# Patient Record
Sex: Female | Born: 1945
Health system: Southern US, Community
[De-identification: ages and names within clinical notes are randomized; demographics above are authoritative.]

## PROBLEM LIST (undated history)

## (undated) DIAGNOSIS — R112 Nausea with vomiting, unspecified: Secondary | ICD-10-CM

## (undated) DIAGNOSIS — Z923 Personal history of irradiation: Secondary | ICD-10-CM

## (undated) DIAGNOSIS — Z9889 Other specified postprocedural states: Secondary | ICD-10-CM

## (undated) DIAGNOSIS — T8859XA Other complications of anesthesia, initial encounter: Secondary | ICD-10-CM

## (undated) DIAGNOSIS — T4145XA Adverse effect of unspecified anesthetic, initial encounter: Secondary | ICD-10-CM

## (undated) DIAGNOSIS — C50411 Malignant neoplasm of upper-outer quadrant of right female breast: Secondary | ICD-10-CM

## (undated) HISTORY — PX: DILATION AND CURETTAGE OF UTERUS: SHX78

## (undated) HISTORY — DX: Malignant neoplasm of upper-outer quadrant of right female breast: C50.411

## (undated) HISTORY — DX: Personal history of irradiation: Z92.3

---

## 2015-04-13 ENCOUNTER — Encounter (HOSPITAL_BASED_OUTPATIENT_CLINIC_OR_DEPARTMENT_OTHER): Payer: Self-pay

## 2015-04-13 ENCOUNTER — Emergency Department (HOSPITAL_BASED_OUTPATIENT_CLINIC_OR_DEPARTMENT_OTHER): Payer: Medicare HMO

## 2015-04-13 ENCOUNTER — Emergency Department (HOSPITAL_BASED_OUTPATIENT_CLINIC_OR_DEPARTMENT_OTHER)
Admission: EM | Admit: 2015-04-13 | Discharge: 2015-04-13 | Disposition: A | Discharge status: Home / Self Care | Payer: Medicare HMO | Attending: Emergency Medicine | Admitting: Emergency Medicine

## 2015-04-13 DIAGNOSIS — R002 Palpitations: Secondary | ICD-10-CM | POA: Diagnosis not present

## 2015-04-13 DIAGNOSIS — R079 Chest pain, unspecified: Secondary | ICD-10-CM | POA: Insufficient documentation

## 2015-04-13 LAB — CBC
HCT: 46.1 % — ABNORMAL HIGH (ref 36.0–46.0)
Hemoglobin: 15.3 g/dL — ABNORMAL HIGH (ref 12.0–15.0)
MCH: 30.3 pg (ref 26.0–34.0)
MCHC: 33.2 g/dL (ref 30.0–36.0)
MCV: 91.3 fL (ref 78.0–100.0)
Platelets: 334 10*3/uL (ref 150–400)
RBC: 5.05 MIL/uL (ref 3.87–5.11)
RDW: 13.7 % (ref 11.5–15.5)
WBC: 9 10*3/uL (ref 4.0–10.5)

## 2015-04-13 LAB — COMPREHENSIVE METABOLIC PANEL
ALBUMIN: 3.8 g/dL (ref 3.5–5.0)
ALK PHOS: 77 U/L (ref 38–126)
ALT: 29 U/L (ref 14–54)
ANION GAP: 8 (ref 5–15)
AST: 34 U/L (ref 15–41)
BILIRUBIN TOTAL: 0.4 mg/dL (ref 0.3–1.2)
BUN: 11 mg/dL (ref 6–20)
CALCIUM: 9.8 mg/dL (ref 8.9–10.3)
CO2: 29 mmol/L (ref 22–32)
Chloride: 102 mmol/L (ref 101–111)
Creatinine, Ser: 0.78 mg/dL (ref 0.44–1.00)
Glucose, Bld: 123 mg/dL — ABNORMAL HIGH (ref 65–99)
Potassium: 4.3 mmol/L (ref 3.5–5.1)
SODIUM: 139 mmol/L (ref 135–145)
Total Protein: 7.6 g/dL (ref 6.5–8.1)

## 2015-04-13 LAB — TROPONIN I: Troponin I: <0.03 ng/mL (ref <0.031)

## 2015-04-13 NOTE — ED Notes (Addendum)
C/o irregular HR with pain to left arm approx 2 hours PTA-took ASA PTA-denies pain at this time-taken to tx area via w/c

## 2015-04-13 NOTE — ED Provider Notes (Signed)
CSN: [id number]     Arrival date & time 04/13/15  1424 History   First MD Initiated Contact with Patient 04/13/15 1441     Chief Complaint  Patient presents with  . Chest Pain     (Consider location/radiation/quality/duration/timing/severity/associated sxs/prior Treatment) Patient is a 69 y.o. female presenting with chest pain. The history is provided by the patient and a relative.  Chest Pain Pain location:  Substernal area and L chest Pain quality: dull and pressure   Pain radiates to:  Mid back and L arm Pain radiates to the back: yes   Pain severity:  Mild Onset quality:  Gradual Duration:  1 hour Timing:  Intermittent Chronicity:  New Context: not breathing, no drug use, not lifting, no movement and no trauma   Associated symptoms: palpitations   Associated symptoms: no abdominal pain, no back pain, no cough, no dizziness, no fever, no headache, no nausea, no shortness of breath and not vomiting     History reviewed. No pertinent past medical history. Past Surgical History  Procedure Laterality Date  . Dilation and curettage of uterus     No family history on file. History  Substance Use Topics  . Smoking status: Never Smoker   . Smokeless tobacco: Not on file  . Alcohol Use: No   OB History    No data available     Review of Systems  Constitutional: Negative for fever, chills and activity change.  HENT: Negative for congestion.   Respiratory: Negative for cough, chest tightness and shortness of breath.   Cardiovascular: Positive for chest pain and palpitations.  Gastrointestinal: Negative for nausea, vomiting and abdominal pain.  Endocrine: Negative for polydipsia and polyuria.  Genitourinary: Negative for dysuria, frequency and pelvic pain.  Musculoskeletal: Negative for back pain.  Skin: Negative for pallor and wound.  Neurological: Negative for dizziness, seizures and headaches.      Allergies  Review of patient's allergies indicates no known  allergies.  Home Medications   Prior to Admission medications   Not on File   BP 147/92 mmHg  Pulse 96  Temp(Src) 98.3 F (36.8 C) (Oral)  Resp 18  Ht 5\' 9"  (1.753 m)  Wt 260 lb (117.935 kg)  BMI 38.38 kg/m2  SpO2 97% Physical Exam  Constitutional: She is oriented to person, place, and time. She appears well-developed and well-nourished.  HENT:  Head: Normocephalic and atraumatic.  Eyes: Conjunctivae and EOM are normal. Right eye exhibits no discharge. Left eye exhibits no discharge.  Cardiovascular: Normal rate and regular rhythm.   Pulmonary/Chest: Effort normal and breath sounds normal. No respiratory distress.  Abdominal: Soft. She exhibits no distension. There is no tenderness. There is no rebound.  Musculoskeletal: Normal range of motion. She exhibits no edema or tenderness.  Neurological: She is alert and oriented to person, place, and time.  Skin: Skin is warm and dry.  Nursing note and vitals reviewed.   ED Course  Procedures (including critical care time) Labs Review Labs Reviewed - No data to display  Imaging Review No results found.   EKG Interpretation   Date/Time:  Wednesday April 13 2015 14:37:14 EDT Ventricular Rate:  93 PR Interval:  152 QRS Duration: 94 QT Interval:  364 QTC Calculation: 452 R Axis:     Text Interpretation:  Normal sinus rhythm Possible Left atrial enlargement  Incomplete right bundle branch block Confirmed by HARRISON  MD, FORREST  ([id number]) on 04/13/2015 2:40:04 PM      MDM   Final diagnoses:  None   C52-year-old female with unknown medical history as she has not seen a doctor in 4 years presents immersed department today for acute onset left-sided chest dullness also in her back and in her neck last approximately 30 minutes no other associated symptoms. Rest of history and exam benign as documented above. Patient is low risk for ACS so we will do a delta troponin. EKG without any evidence of acute ischemia. Cardiac aspirin at  home. No significant cough or fever to be concern for pneumonia. PE also unlikely.   Troponin negative, labs unremarkable. Will repeat troponin 3 hours after initial along with repeat ecg to ensure no changes.   Repeat troponin and ecg negative. Will dc to fu w/ cardiology or pcp.   I have personally and contemperaneously reviewed labs and imaging and used in my decision making as above.   I feel the patient has had an appropriate workup for their chief complaint at this time and likelihood of emergent condition existing is low. They have been counseled on decision, discharge, follow up and which symptoms necessitate immediate return to the emergency department. They or their family verbally stated understanding and agreement with plan and discharged in stable condition.     Merrily Pew, MD 04/13/15 2109

## 2016-05-03 ENCOUNTER — Ambulatory Visit (INDEPENDENT_AMBULATORY_CARE_PROVIDER_SITE_OTHER): Payer: Medicare HMO | Admitting: Family Medicine

## 2016-05-03 ENCOUNTER — Encounter: Payer: Self-pay | Admitting: Family Medicine

## 2016-05-03 VITALS — BP 126/80 | HR 52 | Temp 98.0°F | Resp 16 | Ht 69.0 in | Wt 249.4 lb

## 2016-05-03 DIAGNOSIS — Z1231 Encounter for screening mammogram for malignant neoplasm of breast: Secondary | ICD-10-CM | POA: Diagnosis not present

## 2016-05-03 DIAGNOSIS — M21921 Unspecified acquired deformity of right upper arm: Secondary | ICD-10-CM

## 2016-05-03 DIAGNOSIS — Z0000 Encounter for general adult medical examination without abnormal findings: Secondary | ICD-10-CM

## 2016-05-03 DIAGNOSIS — E669 Obesity, unspecified: Secondary | ICD-10-CM

## 2016-05-03 DIAGNOSIS — M21931 Unspecified acquired deformity of right forearm: Secondary | ICD-10-CM | POA: Diagnosis not present

## 2016-05-03 LAB — HEPATIC FUNCTION PANEL
ALK PHOS: 76 U/L (ref 39–117)
ALT: 21 U/L (ref 0–35)
AST: 25 U/L (ref 0–37)
Albumin: 4.2 g/dL (ref 3.5–5.2)
BILIRUBIN DIRECT: 0.1 mg/dL (ref 0.0–0.3)
Total Bilirubin: 0.6 mg/dL (ref 0.2–1.2)
Total Protein: 7.4 g/dL (ref 6.0–8.3)

## 2016-05-03 LAB — CBC WITH DIFFERENTIAL/PLATELET
BASOS ABS: 0.1 10*3/uL (ref 0.0–0.1)
Basophils Relative: 0.7 % (ref 0.0–3.0)
EOS ABS: 0.1 10*3/uL (ref 0.0–0.7)
Eosinophils Relative: 0.9 % (ref 0.0–5.0)
HCT: 44.5 % (ref 36.0–46.0)
Hemoglobin: 15 g/dL (ref 12.0–15.0)
LYMPHS ABS: 2.4 10*3/uL (ref 0.7–4.0)
Lymphocytes Relative: 26.2 % (ref 12.0–46.0)
MCHC: 33.6 g/dL (ref 30.0–36.0)
MCV: 91.3 fl (ref 78.0–100.0)
Monocytes Absolute: 0.6 10*3/uL (ref 0.1–1.0)
Monocytes Relative: 6.8 % (ref 3.0–12.0)
NEUTROS ABS: 5.9 10*3/uL (ref 1.4–7.7)
NEUTROS PCT: 65.4 % (ref 43.0–77.0)
PLATELETS: 330 10*3/uL (ref 150.0–400.0)
RBC: 4.88 Mil/uL (ref 3.87–5.11)
RDW: 13.5 % (ref 11.5–15.5)
WBC: 9 10*3/uL (ref 4.0–10.5)

## 2016-05-03 LAB — BASIC METABOLIC PANEL
BUN: 14 mg/dL (ref 6–23)
CO2: 29 meq/L (ref 19–32)
Calcium: 10 mg/dL (ref 8.4–10.5)
Chloride: 104 mEq/L (ref 96–112)
Creatinine, Ser: 0.74 mg/dL (ref 0.40–1.20)
GFR: 82.89 mL/min (ref >60.00)
Glucose, Bld: 103 mg/dL — ABNORMAL HIGH (ref 70–99)
POTASSIUM: 4.4 meq/L (ref 3.5–5.1)
SODIUM: 141 meq/L (ref 135–145)

## 2016-05-03 LAB — LIPID PANEL
CHOLESTEROL: 211 mg/dL — AB (ref 0–200)
HDL: 38.9 mg/dL — ABNORMAL LOW (ref >39.00)
LDL Cholesterol: 149 mg/dL — ABNORMAL HIGH (ref 0–99)
NonHDL: 171.67
Total CHOL/HDL Ratio: 5
Triglycerides: 111 mg/dL (ref 0.0–149.0)
VLDL: 22.2 mg/dL (ref 0.0–40.0)

## 2016-05-03 LAB — TSH: TSH: 0.97 u[IU]/mL (ref 0.35–4.50)

## 2016-05-03 NOTE — Progress Notes (Signed)
Pre visit review using our clinic review tool, if applicable. No additional management support is needed unless otherwise documented below in the visit note. 

## 2016-05-03 NOTE — Patient Instructions (Addendum)
Follow up in 1 year or as needed We'll notify you of your lab results and make any changes if needed Continue to work on healthy diet and regular exercise- you can do it!!! We'll call you with your mammogram and ortho appts Please consider a cologuard or colonoscopy referral Call with any questions or concerns Welcome!  We're glad to have you! Enjoy the rest of your summer!!!

## 2016-05-03 NOTE — Progress Notes (Signed)
Subjective:    Patient ID: Mary Ochoa, female    DOB: [birth year], 71 y.o.   MRN: [id number]  HPI New to establish.  Moved from [place] 5 yrs ago- no recent physician  Here today for CPE.  Risk Factors: Obesity- ongoing issue for pt.  Has recently given up soda Physical Activity: exercising regularly on her [place] at home Fall Risk: low Depression: denies Hearing: normal to conversational tones, whispered voice at 6 ft ADL's: independent Cognitive: normal linear thought process, memory and attention intact Home Safety: safe at home Height, Weight, BMI, Visual Acuity: see vitals, vision corrected to 20/20 w/ reading glasses Counseling: due for colonoscopy (pt declines- not interested in cologuard either, 'i'm not going to worry about that right now'), mammo/DEXA (declines DEXA), Prevnar (declines at this time) Care team reviewed- no additional providers at this time [place]- pt does not have, not interested at this time - 'i need to think about that'. Labs Ordered: See A&P Care Plan: See A&P    Review of Systems Patient reports no vision/ hearing changes, adenopathy,fever, weight change,  persistant/recurrent hoarseness , swallowing issues, chest pain, palpitations, edema, persistant/recurrent cough, hemoptysis, dyspnea (rest/exertional/paroxysmal nocturnal), gastrointestinal bleeding (melena, rectal bleeding), abdominal pain, significant heartburn, bowel changes, GU symptoms (dysuria, hematuria, incontinence), Gyn symptoms (abnormal  bleeding, pain),  syncope, focal weakness, memory loss, numbness & tingling, skin/hair/nail changes, abnormal bruising or bleeding, anxiety, or depression.     Objective:   Physical Exam General Appearance:    Alert, cooperative, no distress, appears stated age  Head:    Normocephalic, without obvious abnormality, atraumatic  Eyes:    PERRL, conjunctiva/corneas clear, EOM's intact, fundi    benign, both eyes  Ears:    Normal TM's and  external ear canals, both ears  Nose:   Nares normal, septum midline, mucosa normal, no drainage    or sinus tenderness  Throat:   Lips, mucosa, and tongue normal; teeth and gums normal  Neck:   Supple, symmetrical, trachea midline, no adenopathy;    Thyroid: no enlargement/tenderness/nodules  Back:     Symmetric, no curvature, ROM normal, no CVA tenderness  Lungs:     Clear to auscultation bilaterally, respirations unlabored  Chest Wall:    No tenderness or deformity   Heart:    Regular rate and rhythm, S1 and S2 normal, no murmur, rub   or gallop  Breast Exam:    Deferred to mammo  Abdomen:     Soft, non-tender, bowel sounds active all four quadrants,    no masses, no organomegaly  Genitalia:    Deferred   Rectal:    Extremities:   Extremities normal, atraumatic, no cyanosis or edema- freely mobile soft tissue mass w/ some calcification over R olecranon  Pulses:   2+ and symmetric all extremities  Skin:   Skin color, texture, turgor normal, no rashes or lesions  Lymph nodes:   Cervical, supraclavicular, and axillary nodes normal  Neurologic:   CNII-XII intact, normal strength, sensation and reflexes    throughout          Assessment & Plan:  CPE- pt's PE WNL w/ exception of obesity and R elbow abnormality.  Pt declines Prevnar, colonoscopy, DEXA.  Discussed need for healthcare POA/living will- pt to discuss this w/ family.  Check labs.  Anticipatory guidance provided.   Obesity- ongoing issue for pt.  She recently stopped drinking soda and started exercising 6 days/week on her [place].  Applauded her efforts.  Check labs  to risk stratify.  Will follow.  Elbow Deformity- new.  Pt w/ ganglion cyst vs other abnormality over R olecranon.  Refer to ortho for complete evaluation and tx.  Pt expressed understanding and is in agreement w/ plan.

## 2016-05-04 ENCOUNTER — Encounter: Payer: Self-pay | Admitting: General Practice

## 2016-05-10 ENCOUNTER — Encounter: Payer: Self-pay | Admitting: Family Medicine

## 2016-07-02 DIAGNOSIS — R2231 Localized swelling, mass and lump, right upper limb: Secondary | ICD-10-CM | POA: Diagnosis not present

## 2016-07-03 ENCOUNTER — Ambulatory Visit
Admission: RE | Admit: 2016-07-03 | Discharge: 2016-07-03 | Disposition: A | Discharge status: Home / Self Care | Payer: Medicare HMO | Source: Ambulatory Visit | Attending: Family Medicine | Admitting: Family Medicine

## 2016-07-03 DIAGNOSIS — Z1231 Encounter for screening mammogram for malignant neoplasm of breast: Secondary | ICD-10-CM | POA: Diagnosis not present

## 2016-07-06 ENCOUNTER — Other Ambulatory Visit: Payer: Self-pay | Admitting: Family Medicine

## 2016-07-06 DIAGNOSIS — R928 Other abnormal and inconclusive findings on diagnostic imaging of breast: Secondary | ICD-10-CM

## 2016-07-10 ENCOUNTER — Other Ambulatory Visit: Payer: Self-pay | Admitting: Family Medicine

## 2016-07-10 ENCOUNTER — Ambulatory Visit
Admission: RE | Admit: 2016-07-10 | Discharge: 2016-07-10 | Disposition: A | Discharge status: Home / Self Care | Payer: Medicare HMO | Source: Ambulatory Visit | Attending: Family Medicine | Admitting: Family Medicine

## 2016-07-10 DIAGNOSIS — R928 Other abnormal and inconclusive findings on diagnostic imaging of breast: Secondary | ICD-10-CM

## 2016-07-10 DIAGNOSIS — N6312 Unspecified lump in the right breast, upper inner quadrant: Secondary | ICD-10-CM | POA: Diagnosis not present

## 2016-07-12 ENCOUNTER — Other Ambulatory Visit: Payer: Self-pay | Admitting: Family Medicine

## 2016-07-12 ENCOUNTER — Ambulatory Visit
Admission: RE | Admit: 2016-07-12 | Discharge: 2016-07-12 | Disposition: A | Discharge status: Home / Self Care | Payer: Medicare HMO | Source: Ambulatory Visit | Attending: Family Medicine | Admitting: Family Medicine

## 2016-07-12 DIAGNOSIS — D0511 Intraductal carcinoma in situ of right breast: Secondary | ICD-10-CM | POA: Diagnosis not present

## 2016-07-12 DIAGNOSIS — R928 Other abnormal and inconclusive findings on diagnostic imaging of breast: Secondary | ICD-10-CM

## 2016-07-12 DIAGNOSIS — N6489 Other specified disorders of breast: Secondary | ICD-10-CM | POA: Diagnosis not present

## 2016-07-18 ENCOUNTER — Telehealth: Payer: Self-pay | Admitting: *Deleted

## 2016-07-18 ENCOUNTER — Encounter: Payer: Self-pay | Admitting: *Deleted

## 2016-07-18 DIAGNOSIS — C50411 Malignant neoplasm of upper-outer quadrant of right female breast: Secondary | ICD-10-CM

## 2016-07-18 DIAGNOSIS — Z853 Personal history of malignant neoplasm of breast: Secondary | ICD-10-CM | POA: Insufficient documentation

## 2016-07-18 HISTORY — DX: Malignant neoplasm of upper-outer quadrant of right female breast: C50.411

## 2016-07-18 NOTE — Telephone Encounter (Signed)
Confirmed [facility] for 08/01/16 at 1215 .  Instructions and contact information given.

## 2016-07-18 NOTE — Telephone Encounter (Signed)
Left vm for pt to return call for [facility] on 11/8. Contact information provided.

## 2016-07-26 ENCOUNTER — Telehealth: Payer: Self-pay | Admitting: *Deleted

## 2016-07-26 NOTE — Telephone Encounter (Signed)
Mailed [facility] packet to pt. 

## 2016-08-01 ENCOUNTER — Ambulatory Visit
Admission: RE | Admit: 2016-08-01 | Discharge: 2016-08-01 | Disposition: A | Discharge status: Home / Self Care | Payer: Medicare HMO | Source: Ambulatory Visit | Attending: Radiation Oncology | Admitting: Radiation Oncology

## 2016-08-01 ENCOUNTER — Ambulatory Visit: Payer: Medicare HMO | Admitting: Physical Therapy

## 2016-08-01 ENCOUNTER — Encounter: Payer: Self-pay | Admitting: Oncology

## 2016-08-01 ENCOUNTER — Ambulatory Visit (HOSPITAL_BASED_OUTPATIENT_CLINIC_OR_DEPARTMENT_OTHER): Payer: Medicare HMO | Admitting: Oncology

## 2016-08-01 ENCOUNTER — Other Ambulatory Visit (HOSPITAL_BASED_OUTPATIENT_CLINIC_OR_DEPARTMENT_OTHER): Payer: Medicare HMO

## 2016-08-01 DIAGNOSIS — Z170 Estrogen receptor positive status [ER+]: Secondary | ICD-10-CM

## 2016-08-01 DIAGNOSIS — D0511 Intraductal carcinoma in situ of right breast: Secondary | ICD-10-CM

## 2016-08-01 DIAGNOSIS — C50411 Malignant neoplasm of upper-outer quadrant of right female breast: Secondary | ICD-10-CM

## 2016-08-01 LAB — CBC WITH DIFFERENTIAL/PLATELET
BASO%: 0.6 % (ref 0.0–2.0)
Basophils Absolute: 0.1 10*3/uL (ref 0.0–0.1)
EOS%: 1.2 % (ref 0.0–7.0)
Eosinophils Absolute: 0.1 10*3/uL (ref 0.0–0.5)
HCT: 45.3 % (ref 34.8–46.6)
HGB: 15 g/dL (ref 11.6–15.9)
LYMPH%: 23.6 % (ref 14.0–49.7)
MCH: 30.4 pg (ref 25.1–34.0)
MCHC: 33.1 g/dL (ref 31.5–36.0)
MCV: 92 fL (ref 79.5–101.0)
MONO#: 0.8 10*3/uL (ref 0.1–0.9)
MONO%: 8.5 % (ref 0.0–14.0)
NEUT%: 66.1 % (ref 38.4–76.8)
NEUTROS ABS: 6.2 10*3/uL (ref 1.5–6.5)
PLATELETS: 330 10*3/uL (ref 145–400)
RBC: 4.92 10*6/uL (ref 3.70–5.45)
RDW: 13.6 % (ref 11.2–14.5)
WBC: 9.3 10*3/uL (ref 3.9–10.3)
lymph#: 2.2 10*3/uL (ref 0.9–3.3)

## 2016-08-01 LAB — COMPREHENSIVE METABOLIC PANEL
ALT: 22 U/L (ref 0–55)
ANION GAP: 10 meq/L (ref 3–11)
AST: 26 U/L (ref 5–34)
Albumin: 3.7 g/dL (ref 3.5–5.0)
Alkaline Phosphatase: 84 U/L (ref 40–150)
BILIRUBIN TOTAL: 0.55 mg/dL (ref 0.20–1.20)
BUN: 12.4 mg/dL (ref 7.0–26.0)
CHLORIDE: 105 meq/L (ref 98–109)
CO2: 26 meq/L (ref 22–29)
CREATININE: 0.8 mg/dL (ref 0.6–1.1)
Calcium: 10.2 mg/dL (ref 8.4–10.4)
EGFR: 82 mL/min/{1.73_m2} — ABNORMAL LOW (ref >90)
GLUCOSE: 100 mg/dL (ref 70–140)
Potassium: 4.2 mEq/L (ref 3.5–5.1)
SODIUM: 142 meq/L (ref 136–145)
TOTAL PROTEIN: 7.8 g/dL (ref 6.4–8.3)

## 2016-08-01 NOTE — Progress Notes (Signed)
Radiation Oncology         [phone] ________________________________  Initial Outpatient Consultation  Name: Mary Ochoa MRN: [id number]  Date: 08/01/2016  DOB: 1946-05-01  [id number] Birdie Riddle, MD  Rolm Bookbinder, MD   REFERRING PHYSICIAN: Rolm Bookbinder, MD  DIAGNOSIS: The encounter diagnosis was Malignant neoplasm of upper-outer quadrant of right breast in female, estrogen receptor positive ([place]).  Low-grade intraductal carcinoma  HISTORY OF PRESENT ILLNESS::Mary Ochoa is a 70 y.o. female who had a screening mammogram on 07/03/16 detecting a small, irregular left breast asymmetry measuring 10 x 4 mm. Diagnostic biopsy of the upper outer right breast on 07/12/16 revealed low grade DCIS. ER 100% positive, PR 100% positive.  The patient presents to the multidisciplinary breast clinic with this information. Earlier in the day the patient's x-ray imaging as well as pathology slides were reviewed in the multidisciplinary breast conference   PREVIOUS RADIATION THERAPY: No  PAST MEDICAL HISTORY:  has a past medical history of Malignant neoplasm of upper-outer quadrant of right female breast ([place]) (07/18/2016).    PAST SURGICAL HISTORY: Past Surgical History:  Procedure Laterality Date  . DILATION AND CURETTAGE OF UTERUS      FAMILY HISTORY: family history includes Alcohol abuse in her father; Breast cancer in her mother.  SOCIAL HISTORY:  reports that she has never smoked. She has never used smokeless tobacco. She reports that she does not drink alcohol or use drugs.  ALLERGIES: Patient has no known allergies.  MEDICATIONS:  No current outpatient prescriptions on file.   No current facility-administered medications for this encounter.     REVIEW OF SYSTEMS:  A 15 point review of systems is documented in the electronic medical record. This was obtained by the nursing staff. However, I reviewed this with the patient to discuss relevant findings and make appropriate  changes.   PHYSICAL EXAM:  Vitals with BMI 08/01/2016  Height 5\' 9"   Weight 247 lbs 11 oz  BMI [id number]  Systolic 0000000  Diastolic 73  Pulse 78  Respirations 18  Lungs are clear to auscultation bilaterally. Heart has regular rate and rhythm. No palpable cervical, supraclavicular, or axillary adenopathy. Abdomen soft, non-tender, normal bowel sounds. Left breast large and pendulous without mass or nipple discharge. Right breast large and pendulous without mass or nipple discharge. Patient has a small biopsy site in the approximately 11 o'clock position, 7 cm from the areolar border. Mild induration within the biopsy site consistent with bruising.   ECOG = 0  LABORATORY DATA:  Lab Results  Component Value Date   WBC 9.3 08/01/2016   HGB 15.0 08/01/2016   HCT 45.3 08/01/2016   MCV 92.0 08/01/2016   PLT 330 08/01/2016   NEUTROABS 6.2 08/01/2016   Lab Results  Component Value Date   NA 142 08/01/2016   K 4.2 08/01/2016   CL 104 05/03/2016   CO2 26 08/01/2016   GLUCOSE 100 08/01/2016   CREATININE 0.8 08/01/2016   CALCIUM 10.2 08/01/2016      RADIOGRAPHY: US Breast Ltd Uni Right Inc Axilla  Result Date: 07/10/2016 CLINICAL DATA:  Possible asymmetry in the outer right breast on a recent screening mammogram without comparisons. EXAM: DIGITAL DIAGNOSTIC RIGHT MAMMOGRAM ULTRASOUND RIGHT BREAST COMPARISON:  Previous exam(s). ACR Breast Density Category b: There are scattered areas of fibroglandular density. FINDINGS: 2D and 3D spot compression views of the right breast were obtained. These confirm a small, irregular area of asymmetrical density measuring 10 x 4 mm and containing a small amount of  punctate, rounded microcalcification in the upper outer right breast in the middle third. On physical exam, no mass is palpable in the upper outer right breast. Targeted ultrasound is performed, showing no visible abnormality corresponding to the irregular asymmetrical density in the outer right  breast. IMPRESSION: 10 x 4 mm irregular asymmetrical density in the upper outer right breast with no ultrasound correlate. This is concerning for the possibility of malignancy. RECOMMENDATION: 3D stereotactic guided core needle biopsy of the 10 x 4 mm irregular asymmetrical density in the upper outer right breast. This has been discussed with the patient and scheduled at 10:30 a.m. on 07/12/2016. I have discussed the findings and recommendations with the patient. Results were also provided in writing at the conclusion of the visit. If applicable, a reminder letter will be sent to the patient regarding the next appointment. BI-RADS CATEGORY  4: Suspicious. Electronically Signed   By: Claudie Revering M.D.   On: 07/10/2016 09:56   Mm Diag Breast Tomo Uni Right  Result Date: 07/10/2016 CLINICAL DATA:  Possible asymmetry in the outer right breast on a recent screening mammogram without comparisons. EXAM: DIGITAL DIAGNOSTIC RIGHT MAMMOGRAM ULTRASOUND RIGHT BREAST COMPARISON:  Previous exam(s). ACR Breast Density Category b: There are scattered areas of fibroglandular density. FINDINGS: 2D and 3D spot compression views of the right breast were obtained. These confirm a small, irregular area of asymmetrical density measuring 10 x 4 mm and containing a small amount of punctate, rounded microcalcification in the upper outer right breast in the middle third. On physical exam, no mass is palpable in the upper outer right breast. Targeted ultrasound is performed, showing no visible abnormality corresponding to the irregular asymmetrical density in the outer right breast. IMPRESSION: 10 x 4 mm irregular asymmetrical density in the upper outer right breast with no ultrasound correlate. This is concerning for the possibility of malignancy. RECOMMENDATION: 3D stereotactic guided core needle biopsy of the 10 x 4 mm irregular asymmetrical density in the upper outer right breast. This has been discussed with the patient and scheduled  at 10:30 a.m. on 07/12/2016. I have discussed the findings and recommendations with the patient. Results were also provided in writing at the conclusion of the visit. If applicable, a reminder letter will be sent to the patient regarding the next appointment. BI-RADS CATEGORY  4: Suspicious. Electronically Signed   By: Claudie Revering M.D.   On: 07/10/2016 09:56   Mm Screening Breast Tomo Bilateral  Result Date: 07/05/2016 CLINICAL DATA:  Screening. EXAM: 2D DIGITAL SCREENING BILATERAL MAMMOGRAM WITH CAD AND ADJUNCT TOMO COMPARISON:  None. ACR Breast Density Category b: There are scattered areas of fibroglandular density. FINDINGS: In the right breast, a possible asymmetry warrants further evaluation. In the left breast, no findings suspicious for malignancy. Images were processed with CAD. IMPRESSION: Further evaluation is suggested for possible asymmetry in the right breast. RECOMMENDATION: Diagnostic mammogram and possibly ultrasound of the right breast. (Code:[id number]) The patient will be contacted regarding the findings, and additional imaging will be scheduled. BI-RADS CATEGORY  0: Incomplete. Need additional imaging evaluation and/or prior mammograms for comparison. Electronically Signed   By: Fidela Salisbury M.D.   On: 07/05/2016 14:46   Mm Clip Placement Right  Result Date: 07/12/2016 CLINICAL DATA:  Evaluate clip placement following stereotactic tomosynthesis guided right breast biopsy. EXAM: 3D DIAGNOSTIC RIGHT MAMMOGRAM POST STEREOTACTIC TOMOSYNTHESIS GUIDED BIOPSY COMPARISON:  Previous exam(s). FINDINGS: 3D Mammographic images were obtained following stereotactic and tomosynthesis guided guided biopsy of 9 mm asymmetry within  the upper-outer right breast. The coil shaped clip is in satisfactory position. IMPRESSION: Satisfactory clip position following stereotactic/ tomosynthesis guided right breast biopsy. Final Assessment: Post Procedure Mammograms for Marker Placement Electronically Signed    By: Margarette Canada M.D.   On: 07/12/2016 11:47   Mm Rt Breast Bx W Loc Dev 1st Lesion Image Bx Spec Stereo Guide  Addendum Date: 07/13/2016   ADDENDUM REPORT: 07/13/2016 11:35 ADDENDUM: Pathology revealed low grade ductal carcinoma in situ in the right breast. This was found to be concordant by Dr. Hassan Rowan. Pathology results were discussed with the patient by telephone. The patient reported doing well after the biopsy. Post biopsy instructions and care were reviewed and questions were answered. The patient was encouraged to call [facility] for any additional concerns. Surgical consultation has been arranged with Dr. Adonis Housekeeper at [facility] on July 18, 2016. Pathology results reported by Susa Raring RN, BSN on 07/13/2016. Electronically Signed   By: Margarette Canada M.D.   On: 07/13/2016 11:35   Result Date: 07/13/2016 CLINICAL DATA:  70 year old female for tissue sampling of upper outer right breast asymmetry. EXAM: RIGHT BREAST STEREOTACTIC CORE NEEDLE BIOPSY COMPARISON:  Previous exams. FINDINGS: The patient and I discussed the procedure of stereotactic-guided biopsy including benefits and alternatives. We discussed the high likelihood of a successful procedure. We discussed the risks of the procedure including infection, bleeding, tissue injury, clip migration, and inadequate sampling. Informed written consent was given. The usual time out protocol was performed immediately prior to the procedure. Using sterile technique and 1% Lidocaine as local anesthetic, under stereotactic guidance, a 9 gauge vacuum assisted needle device was used to perform core needle biopsy of asymmetry within the upper-outer right breast using a superior approach. At the conclusion of the procedure, a coil shaped tissue marker clip was deployed into the biopsy cavity. Follow-up 2-view mammogram was performed and dictated separately. IMPRESSION: Stereotactic-guided biopsy of  upper-outer right breast asymmetry. No apparent complications. Pathology will be followed. Electronically Signed: By: Margarette Canada M.D. On: 07/13/2016 07:54      IMPRESSION: Low grade DCIS of the right breast. Patient would be an excellent candidate for the COMET study, but is not interested in participating at this time. Patient would be a good candidate for breast conservation therapy with radiation directed at the right breast; we discussed options of external radiation and brachytherapy with the mammosite treatment device. The patient will meet with Dr. Donne Hazel this afternoon to further discuss her options regarding management.   PLAN: Patient will be seen back after her surgery. I've asked her to call if she has any additional questions regarding the differences in treatment approaches regarding external beam radiation therapy and partial breast irradiation therapy with remote afterloading techniques.    ------------------------------------------------  Blair Promise, PhD, MD     This document serves as a record of services personally performed by Gery Pray, MD. It was created on his behalf by Maryla Morrow, a trained medical scribe. The creation of this record is based on the scribe's personal observations and the provider's statements to them. This document has been checked and approved by the attending provider.

## 2016-08-02 DIAGNOSIS — D0511 Intraductal carcinoma in situ of right breast: Secondary | ICD-10-CM | POA: Insufficient documentation

## 2016-08-02 NOTE — Progress Notes (Signed)
ON[facility] DISTRESS SCREENING 08/02/2016  Screening Type Initial Screening  Distress experienced in past week (1-10) 3  Emotional problem type Adjusting to illness  Referral to support programs Yes   [facility] member did not meet with the patient directly during Clinic but [place] were given to the patient.   Rosana Fret, Counseling Intern Supervisor: Lorrin Jackson, lead Chaplain

## 2016-08-02 NOTE — Progress Notes (Signed)
[place]  [phone] Fax:([phone]) [phone]     ID: Mary Ochoa DOB: June 25, 1946  MR#: [id number]  [id number]  Patient Care Team: Midge Minium, MD as PCP - General (Family Medicine) Rolm Bookbinder, MD as Consulting Physician (General Surgery) Chauncey Cruel, MD as Consulting Physician (Oncology) Gery Pray, MD as Consulting Physician (Radiation Oncology) Chauncey Cruel, MD OTHER MD:  CHIEF COMPLAINT: Ductal carcinoma in situ, estrogen receptor positive  CURRENT TREATMENT: Awaiting definitive surgery   BREAST CANCER HISTORY: Kerstie had bilateral screening mammography with tomography at the [facility] 07/05/2016 showing an area of possible asymmetry in the right breast. On 07/10/2016 she underwent diagnostic right mammography with right breast ultrasonography. The breast density was category B. Spot compression views identified an irregular area of asymmetric density in the upper outer quadrant of the right breast measuring 1.0 cm associated with punctate calcifications. This was not palpable and was not seen by ultrasound.  Accordingly the patient underwent an AFFIRM biopsy on 07/12/2016. This showed ([id number] [id number]) ductal carcinoma in situ, low-grade, 100% estrogen receptor positive, 100% progesterone receptor positive, all with strong staining intensity.  Her subsequent history is as detailed below  INTERVAL HISTORY: The low was evaluated in the multidisciplinary breast cancer clinic 08/01/2016 accompanied by her daughter Carolann Littler. Her case was also presented in the multidisciplinary breast cancer conference that same morning. At that time a preliminary plan was proposed: Consider the COMET trial, consider breast conserving surgery with MammoSite, consider anti-estrogens.  REVIEW OF SYSTEMS: There were no specific symptoms leading to the original mammogram, which was routinely scheduled. The patient denies unusual headaches, visual  changes, nausea, vomiting, stiff neck, dizziness, or gait imbalance. There has been no cough, phlegm production, or pleurisy, no chest pain or pressure, and no change in bowel or bladder habits. The patient denies fever, rash, bleeding, unexplained fatigue or unexplained weight loss. Because of her family history she is very worried about heart issues and particularly is concern about problems with anesthesia.A detailed review of systems was otherwise entirely negative.   PAST MEDICAL HISTORY: Past Medical History:  Diagnosis Date  . Malignant neoplasm of upper-outer quadrant of right female breast ([place]) 07/18/2016    PAST SURGICAL HISTORY: Past Surgical History:  Procedure Laterality Date  . DILATION AND CURETTAGE OF UTERUS      FAMILY HISTORY Family History  Problem Relation Age of Onset  . Breast cancer Mother   . Alcohol abuse Father   the patient's father died at the age of 63 from cirrhosis of the liver. The patient's mother died at age 70 after having been recently diagnosed with breast cancer. Patient had 2 brothers, one sister. There is no history of ovarian cancer in the family  GYNECOLOGIC HISTORY:  No LMP recorded. Patient is postmenopausal. Menarche age 61, first live birth age 17, she is [place] P5. She does not recall exactly when she stopped having periods but she never took hormone replacement or oral contraceptives.  SOCIAL HISTORY:  She is a retired Scientist, forensic. She is widowed and lives by herself, with no pets. Her children are Marzetta Board who is a Software engineer and coral [place], Anderson Malta who is a Warehouse manager in [place], [place] who is one of our pharmacists hearing [place], Baron Sane works in Architect in [place], and [place] lives in [place]. The patient has 16 grandchildren. She attends [employer] DIRECTIVES: not in place. The patient states "the children know my wishes". If there is  any significant disagreement she would  name her daughter Anderson Malta as healthcare part of attorney. Her number is [phone]   HEALTH MAINTENANCE: Social History  Substance Use Topics  . Smoking status: Never Smoker  . Smokeless tobacco: Never Used  . Alcohol use No     Colonoscopy:  PAP:  Bone density:   No Known Allergies  No current outpatient prescriptions on file.   No current facility-administered medications for this visit.     OBJECTIVE: middle-aged white woman in no acute distress Vitals:   08/01/16 1240  BP: (!) 160/73  Pulse: 78  Resp: 18  Temp: 97.9 F (36.6 C)     Body mass index is 36.58 kg/m.    ECOG FS:0 - Asymptomatic  Ocular: Sclerae unicteric, pupils equal, round and reactive to light Ear-nose-throat: Oropharynx clear and moist Lymphatic: No cervical or supraclavicular adenopathy Lungs no rales or rhonchi, good excursion bilaterally Heart regular rate and rhythm, no murmur appreciated Abd soft, obese,nontender, positive bowel sounds MSK no focal spinal tenderness, no joint edema Neuro: non-focal, well-oriented, appropriate affect Breasts: the right breast is status post recent biopsy. I do not palpate any mass. There are no skin or nipple changes of concern. The right axilla is benign. The left breast is unremarkable.   LAB RESULTS:  CMP     Component Value Date/Time   NA 142 08/01/2016 1206   K 4.2 08/01/2016 1206   CL 104 05/03/2016 1416   CO2 26 08/01/2016 1206   GLUCOSE 100 08/01/2016 1206   BUN 12.4 08/01/2016 1206   CREATININE 0.8 08/01/2016 1206   CALCIUM 10.2 08/01/2016 1206   PROT 7.8 08/01/2016 1206   ALBUMIN 3.7 08/01/2016 1206   AST 26 08/01/2016 1206   ALT 22 08/01/2016 1206   ALKPHOS 84 08/01/2016 1206   BILITOT 0.55 08/01/2016 1206   GFRNONAA >60 04/13/2015 1518   GFRAA >60 04/13/2015 1518    INo results found for: SPEP, UPEP  Lab Results  Component Value Date   WBC 9.3 08/01/2016   NEUTROABS 6.2 08/01/2016   HGB 15.0 08/01/2016   HCT 45.3  08/01/2016   MCV 92.0 08/01/2016   PLT 330 08/01/2016      Chemistry      Component Value Date/Time   NA 142 08/01/2016 1206   K 4.2 08/01/2016 1206   CL 104 05/03/2016 1416   CO2 26 08/01/2016 1206   BUN 12.4 08/01/2016 1206   CREATININE 0.8 08/01/2016 1206      Component Value Date/Time   CALCIUM 10.2 08/01/2016 1206   ALKPHOS 84 08/01/2016 1206   AST 26 08/01/2016 1206   ALT 22 08/01/2016 1206   BILITOT 0.55 08/01/2016 1206       No results found for: LABCA2  No components found for: LABCA125  No results for input(s): INR in the last 168 hours.  Urinalysis No results found for: COLORURINE, APPEARANCEUR, LABSPEC, PHURINE, GLUCOSEU, HGBUR, BILIRUBINUR, KETONESUR, PROTEINUR, UROBILINOGEN, NITRITE, LEUKOCYTESUR   STUDIES: US Breast Ltd Uni Right Inc Axilla  Result Date: 07/10/2016 CLINICAL DATA:  Possible asymmetry in the outer right breast on a recent screening mammogram without comparisons. EXAM: DIGITAL DIAGNOSTIC RIGHT MAMMOGRAM ULTRASOUND RIGHT BREAST COMPARISON:  Previous exam(s). ACR Breast Density Category b: There are scattered areas of fibroglandular density. FINDINGS: 2D and 3D spot compression views of the right breast were obtained. These confirm a small, irregular area of asymmetrical density measuring 10 x 4 mm and containing a small amount of punctate, rounded  microcalcification in the upper outer right breast in the middle third. On physical exam, no mass is palpable in the upper outer right breast. Targeted ultrasound is performed, showing no visible abnormality corresponding to the irregular asymmetrical density in the outer right breast. IMPRESSION: 10 x 4 mm irregular asymmetrical density in the upper outer right breast with no ultrasound correlate. This is concerning for the possibility of malignancy. RECOMMENDATION: 3D stereotactic guided core needle biopsy of the 10 x 4 mm irregular asymmetrical density in the upper outer right breast. This has been  discussed with the patient and scheduled at 10:30 a.m. on 07/12/2016. I have discussed the findings and recommendations with the patient. Results were also provided in writing at the conclusion of the visit. If applicable, a reminder letter will be sent to the patient regarding the next appointment. BI-RADS CATEGORY  4: Suspicious. Electronically Signed   By: Claudie Revering M.D.   On: 07/10/2016 09:56   Mm Diag Breast Tomo Uni Right  Result Date: 07/10/2016 CLINICAL DATA:  Possible asymmetry in the outer right breast on a recent screening mammogram without comparisons. EXAM: DIGITAL DIAGNOSTIC RIGHT MAMMOGRAM ULTRASOUND RIGHT BREAST COMPARISON:  Previous exam(s). ACR Breast Density Category b: There are scattered areas of fibroglandular density. FINDINGS: 2D and 3D spot compression views of the right breast were obtained. These confirm a small, irregular area of asymmetrical density measuring 10 x 4 mm and containing a small amount of punctate, rounded microcalcification in the upper outer right breast in the middle third. On physical exam, no mass is palpable in the upper outer right breast. Targeted ultrasound is performed, showing no visible abnormality corresponding to the irregular asymmetrical density in the outer right breast. IMPRESSION: 10 x 4 mm irregular asymmetrical density in the upper outer right breast with no ultrasound correlate. This is concerning for the possibility of malignancy. RECOMMENDATION: 3D stereotactic guided core needle biopsy of the 10 x 4 mm irregular asymmetrical density in the upper outer right breast. This has been discussed with the patient and scheduled at 10:30 a.m. on 07/12/2016. I have discussed the findings and recommendations with the patient. Results were also provided in writing at the conclusion of the visit. If applicable, a reminder letter will be sent to the patient regarding the next appointment. BI-RADS CATEGORY  4: Suspicious. Electronically Signed   By: Claudie Revering M.D.   On: 07/10/2016 09:56   Mm Clip Placement Right  Result Date: 07/12/2016 CLINICAL DATA:  Evaluate clip placement following stereotactic tomosynthesis guided right breast biopsy. EXAM: 3D DIAGNOSTIC RIGHT MAMMOGRAM POST STEREOTACTIC TOMOSYNTHESIS GUIDED BIOPSY COMPARISON:  Previous exam(s). FINDINGS: 3D Mammographic images were obtained following stereotactic and tomosynthesis guided guided biopsy of 9 mm asymmetry within the upper-outer right breast. The coil shaped clip is in satisfactory position. IMPRESSION: Satisfactory clip position following stereotactic/ tomosynthesis guided right breast biopsy. Final Assessment: Post Procedure Mammograms for Marker Placement Electronically Signed   By: Margarette Canada M.D.   On: 07/12/2016 11:47   Mm Rt Breast Bx W Loc Dev 1st Lesion Image Bx Spec Stereo Guide  Addendum Date: 07/13/2016   ADDENDUM REPORT: 07/13/2016 11:35 ADDENDUM: Pathology revealed low grade ductal carcinoma in situ in the right breast. This was found to be concordant by Dr. Hassan Rowan. Pathology results were discussed with the patient by telephone. The patient reported doing well after the biopsy. Post biopsy instructions and care were reviewed and questions were answered. The patient was encouraged to call [facility] for any additional concerns. Surgical consultation has been arranged with Dr. Adonis Housekeeper at [facility] on July 18, 2016. Pathology results reported by Susa Raring RN, BSN on 07/13/2016. Electronically Signed   By: Margarette Canada M.D.   On: 07/13/2016 11:35   Result Date: 07/13/2016 CLINICAL DATA:  70 year old female for tissue sampling of upper outer right breast asymmetry. EXAM: RIGHT BREAST STEREOTACTIC CORE NEEDLE BIOPSY COMPARISON:  Previous exams. FINDINGS: The patient and I discussed the procedure of stereotactic-guided biopsy including benefits and alternatives. We discussed the high likelihood of a successful  procedure. We discussed the risks of the procedure including infection, bleeding, tissue injury, clip migration, and inadequate sampling. Informed written consent was given. The usual time out protocol was performed immediately prior to the procedure. Using sterile technique and 1% Lidocaine as local anesthetic, under stereotactic guidance, a 9 gauge vacuum assisted needle device was used to perform core needle biopsy of asymmetry within the upper-outer right breast using a superior approach. At the conclusion of the procedure, a coil shaped tissue marker clip was deployed into the biopsy cavity. Follow-up 2-view mammogram was performed and dictated separately. IMPRESSION: Stereotactic-guided biopsy of upper-outer right breast asymmetry. No apparent complications. Pathology will be followed. Electronically Signed: By: Margarette Canada M.D. On: 07/13/2016 07:54    ELIGIBLE FOR AVAILABLE RESEARCH PROTOCOL: COMET/  Patient refused participation  ASSESSMENT: 70 y.o.  [place] woman status post right breastupper outer quadrant biopsy 07/12/2016 for ductal carcinoma in situ, low-grade, estrogen and progesterone receptor strongly positive   (1) discussed the Comet trial, but patient declined to participate  (2)Definitive surgery pending   (3)Adjuvant radiation to follow   (4) consider anti-estrogens at the completion of local treatment    PLAN: We spent the better part of today's hour-long appointment discussing the biology of breast cancer in general, and the specifics of the patient's tumor in particular.Naylah understands that in noninvasive ductal carcinoma, also called ductal carcinoma in situ ("DCIS") the breast cancer cells remain trapped in the ducts were they started. They cannot travel to a vital organ. For that reason these cancers in themselves are not life-threatening.  If the whole breast is removed then all the ducts are removed and since the cancer cells are trapped in the ducts, the cure  rate with mastectomy for noninvasive breast cancer is approximately 99%. Nevertheless we recommend lumpectomy, because there is no survival advantage to mastectomy and because the cosmetic result is generally superior with breast conservation.  Since the patient is keeping her breasts, there will be some risk of recurrence. The recurrence can only be in the same breast since, again, the cells are trapped in the ducts. There is no connection from one breast to the other. The risk of local recurrence is cut by more than half with radiation, which is standard in this situation.  In Tayte's case, Mammaprint  Is a particularly attractive option and she is agreeable to  In estrogen receptor positive cancers like Ryane's, anti-estrogens can also be considered. They will further reduce the risk of recurrence.. In addition anti-estrogens will lower the risk of a new breast cancer developing in either breast, by one half. That risk otherwise approaches 1% per year. Anti-estrogens reduce it to 1/2%.  We discussed the common trial in detail and she understands that she can actually choose her treatment and what she is really doing is adding her name to a database that eventually will be very important in helping [place] decide how  best to treat noninvasive breast cancer. However she "does not want to be part of a trial".  Accordingly the overall plan is for surgery, followed by radiation, then a discussion of anti-estrogens.  Hayleen has a good understanding of the overall plan. She agrees with it. She knows the goal of treatment in her case is cure. She will call with any problems that may develop before her next visit here.  Chauncey Cruel, MD   08/02/2016 11:52 AM Medical Oncology and Hematology [facility] [address] [place], [place] [address] Tel. [phone]    Fax. [phone]

## 2016-08-06 ENCOUNTER — Other Ambulatory Visit: Payer: Self-pay | Admitting: General Surgery

## 2016-08-06 ENCOUNTER — Telehealth: Payer: Self-pay | Admitting: *Deleted

## 2016-08-06 DIAGNOSIS — D0511 Intraductal carcinoma in situ of right breast: Secondary | ICD-10-CM

## 2016-08-06 NOTE — Telephone Encounter (Signed)
  Oncology Nurse Navigator Documentation  Navigator Location: [facility] (08/06/16 1300)   )Navigator Encounter Type: Telephone (08/06/16 1300) Telephone: Mary Ochoa Call;Appt Confirmation/Clarification;Clinic/MDC Follow-up (08/06/16 1300)     Surgery Date: 08/27/16 (08/06/16 1300)           Treatment Initiated Date: 08/27/16 (08/06/16 1300)  Pt relate doing well and without questions r/t dx or treatment care plan. Discussed she has decided to move forward with Mammosite for xrt. Physician team notified. Encourage pt to call with needs. Received verbal understanding. Contact information provided.                              Time Spent with Patient: 15 (08/06/16 1300)

## 2016-08-21 ENCOUNTER — Encounter (HOSPITAL_BASED_OUTPATIENT_CLINIC_OR_DEPARTMENT_OTHER): Payer: Self-pay | Admitting: *Deleted

## 2016-08-22 ENCOUNTER — Telehealth: Payer: Self-pay | Admitting: Oncology

## 2016-08-22 NOTE — Telephone Encounter (Signed)
Called Mary Ochoa to see if she has any questions about the mammosite procedure next week.  Mary Ochoa said the only thing she is worried about is if her insurance will cover her surgery and radiation.  Advised her that I would check with our financial advocates about radiation and that she should call Dr. Cristal Generous office about the surgery.  Mary Ochoa verbalized understanding and agreement.  Left a message for Mary Ochoa, Financial Advocate about insurance coverage for radiation.

## 2016-08-24 ENCOUNTER — Ambulatory Visit
Admission: RE | Admit: 2016-08-24 | Discharge: 2016-08-24 | Disposition: A | Discharge status: Home / Self Care | Payer: Medicare HMO | Source: Ambulatory Visit | Attending: General Surgery | Admitting: General Surgery

## 2016-08-24 DIAGNOSIS — D0511 Intraductal carcinoma in situ of right breast: Secondary | ICD-10-CM

## 2016-08-24 NOTE — Progress Notes (Signed)
Pt given  8oz carton of boost breeze with written and verbal instructions to drink by 530  morning of surgery and nothing else after midnight.  Teach back and pt voiced understanding

## 2016-08-27 ENCOUNTER — Encounter (HOSPITAL_BASED_OUTPATIENT_CLINIC_OR_DEPARTMENT_OTHER)
Admission: RE | Disposition: A | Discharge status: Home / Self Care | Payer: Self-pay | Source: Ambulatory Visit | Attending: General Surgery

## 2016-08-27 ENCOUNTER — Ambulatory Visit (HOSPITAL_BASED_OUTPATIENT_CLINIC_OR_DEPARTMENT_OTHER): Payer: Medicare HMO | Admitting: Anesthesiology

## 2016-08-27 ENCOUNTER — Ambulatory Visit (HOSPITAL_COMMUNITY): Payer: Medicare HMO

## 2016-08-27 ENCOUNTER — Encounter (HOSPITAL_BASED_OUTPATIENT_CLINIC_OR_DEPARTMENT_OTHER): Payer: Self-pay | Admitting: Anesthesiology

## 2016-08-27 ENCOUNTER — Ambulatory Visit (HOSPITAL_BASED_OUTPATIENT_CLINIC_OR_DEPARTMENT_OTHER)
Admission: RE | Admit: 2016-08-27 | Discharge: 2016-08-27 | Disposition: A | Discharge status: Home / Self Care | Payer: Medicare HMO | Source: Ambulatory Visit | Attending: General Surgery | Admitting: General Surgery

## 2016-08-27 ENCOUNTER — Ambulatory Visit
Admission: RE | Admit: 2016-08-27 | Discharge: 2016-08-27 | Disposition: A | Discharge status: Home / Self Care | Payer: Medicare HMO | Source: Ambulatory Visit | Attending: General Surgery | Admitting: General Surgery

## 2016-08-27 ENCOUNTER — Telehealth: Payer: Self-pay | Admitting: Oncology

## 2016-08-27 DIAGNOSIS — D0511 Intraductal carcinoma in situ of right breast: Secondary | ICD-10-CM | POA: Insufficient documentation

## 2016-08-27 DIAGNOSIS — C50411 Malignant neoplasm of upper-outer quadrant of right female breast: Secondary | ICD-10-CM | POA: Diagnosis not present

## 2016-08-27 HISTORY — DX: Other specified postprocedural states: Z98.890

## 2016-08-27 HISTORY — DX: Adverse effect of unspecified anesthetic, initial encounter: T41.45XA

## 2016-08-27 HISTORY — DX: Other complications of anesthesia, initial encounter: T88.59XA

## 2016-08-27 HISTORY — PX: BREAST LUMPECTOMY: SHX2

## 2016-08-27 HISTORY — DX: Nausea with vomiting, unspecified: R11.2

## 2016-08-27 HISTORY — PX: BREAST MAMMOSITE: SHX5264

## 2016-08-27 HISTORY — PX: BREAST LUMPECTOMY WITH RADIOACTIVE SEED LOCALIZATION: SHX6424

## 2016-08-27 SURGERY — BREAST LUMPECTOMY WITH RADIOACTIVE SEED LOCALIZATION
Anesthesia: General | Site: Breast | Laterality: Right

## 2016-08-27 MED ORDER — ACETAMINOPHEN 500 MG PO TABS
ORAL_TABLET | ORAL | Status: AC
  Filled 2016-08-27: qty 2

## 2016-08-27 MED ORDER — GABAPENTIN 300 MG PO CAPS
300.0000 mg | ORAL_CAPSULE | ORAL | Status: AC
  Administered 2016-08-27: 300 mg via ORAL

## 2016-08-27 MED ORDER — BACITRACIN ZINC 500 UNIT/GM EX OINT
TOPICAL_OINTMENT | CUTANEOUS | Status: AC
  Filled 2016-08-27: qty 1.8

## 2016-08-27 MED ORDER — ACETAMINOPHEN 500 MG PO TABS
1000.0000 mg | ORAL_TABLET | ORAL | Status: AC
  Administered 2016-08-27: 1000 mg via ORAL

## 2016-08-27 MED ORDER — FENTANYL CITRATE (PF) 100 MCG/2ML IJ SOLN
INTRAMUSCULAR | Status: AC
  Filled 2016-08-27: qty 2

## 2016-08-27 MED ORDER — MIDAZOLAM HCL 2 MG/2ML IJ SOLN
INTRAMUSCULAR | Status: AC
  Filled 2016-08-27: qty 2

## 2016-08-27 MED ORDER — MIDAZOLAM HCL 2 MG/2ML IJ SOLN: 1.0000 mg | INTRAMUSCULAR | Status: DC | PRN

## 2016-08-27 MED ORDER — PROPOFOL 10 MG/ML IV BOLUS
INTRAVENOUS | Status: DC | PRN
  Administered 2016-08-27: 200 mg via INTRAVENOUS

## 2016-08-27 MED ORDER — PHENYLEPHRINE HCL 10 MG/ML IJ SOLN
INTRAMUSCULAR | Status: DC | PRN
  Administered 2016-08-27 (×2): 80 ug via INTRAVENOUS

## 2016-08-27 MED ORDER — CEFAZOLIN SODIUM-DEXTROSE 2-4 GM/100ML-% IV SOLN
2.0000 g | INTRAVENOUS | Status: AC
  Administered 2016-08-27: 2 g via INTRAVENOUS

## 2016-08-27 MED ORDER — GLYCOPYRROLATE 0.2 MG/ML IJ SOLN
INTRAMUSCULAR | Status: DC | PRN
  Administered 2016-08-27: 0.2 mg via INTRAVENOUS

## 2016-08-27 MED ORDER — SCOPOLAMINE 1 MG/3DAYS TD PT72: 1.0000 | MEDICATED_PATCH | Freq: Once | TRANSDERMAL | Status: DC | PRN

## 2016-08-27 MED ORDER — IOPAMIDOL (ISOVUE-300) INJECTION 61%
INTRAVENOUS | Status: AC
  Filled 2016-08-27: qty 50

## 2016-08-27 MED ORDER — ONDANSETRON HCL 4 MG/2ML IJ SOLN
INTRAMUSCULAR | Status: DC | PRN
  Administered 2016-08-27: 4 mg via INTRAVENOUS

## 2016-08-27 MED ORDER — HYDROCODONE-ACETAMINOPHEN 5-325 MG PO TABS
ORAL_TABLET | ORAL | Status: AC
  Filled 2016-08-27: qty 2

## 2016-08-27 MED ORDER — 0.9 % SODIUM CHLORIDE (POUR BTL) OPTIME
TOPICAL | Status: DC | PRN
  Administered 2016-08-27: 120 mL

## 2016-08-27 MED ORDER — LACTATED RINGERS IV SOLN
INTRAVENOUS | Status: DC
  Administered 2016-08-27 (×3): via INTRAVENOUS

## 2016-08-27 MED ORDER — HYDROMORPHONE HCL 1 MG/ML IJ SOLN: 0.2500 mg | INTRAMUSCULAR | Status: DC | PRN

## 2016-08-27 MED ORDER — GLYCOPYRROLATE 0.2 MG/ML IV SOSY
PREFILLED_SYRINGE | INTRAVENOUS | Status: AC
  Filled 2016-08-27: qty 3

## 2016-08-27 MED ORDER — MIDAZOLAM HCL 5 MG/5ML IJ SOLN
INTRAMUSCULAR | Status: DC | PRN
  Administered 2016-08-27: 2 mg via INTRAVENOUS

## 2016-08-27 MED ORDER — ONDANSETRON HCL 4 MG/2ML IJ SOLN
INTRAMUSCULAR | Status: AC
  Filled 2016-08-27: qty 2

## 2016-08-27 MED ORDER — SCOPOLAMINE 1 MG/3DAYS TD PT72
MEDICATED_PATCH | TRANSDERMAL | Status: AC
  Filled 2016-08-27: qty 1

## 2016-08-27 MED ORDER — GABAPENTIN 300 MG PO CAPS
ORAL_CAPSULE | ORAL | Status: AC
  Filled 2016-08-27: qty 1

## 2016-08-27 MED ORDER — PROPOFOL 10 MG/ML IV BOLUS
INTRAVENOUS | Status: AC
  Filled 2016-08-27: qty 20

## 2016-08-27 MED ORDER — HYDROCODONE-ACETAMINOPHEN 5-325 MG PO TABS
2.0000 | ORAL_TABLET | Freq: Once | ORAL | Status: AC
  Administered 2016-08-27: 2 via ORAL

## 2016-08-27 MED ORDER — BUPIVACAINE HCL (PF) 0.25 % IJ SOLN
INTRAMUSCULAR | Status: DC | PRN
  Administered 2016-08-27: 10 mL

## 2016-08-27 MED ORDER — KETOROLAC TROMETHAMINE 30 MG/ML IJ SOLN
INTRAMUSCULAR | Status: AC
  Filled 2016-08-27: qty 1

## 2016-08-27 MED ORDER — LIDOCAINE 2% (20 MG/ML) 5 ML SYRINGE
INTRAMUSCULAR | Status: AC
  Filled 2016-08-27: qty 5

## 2016-08-27 MED ORDER — KETOROLAC TROMETHAMINE 30 MG/ML IJ SOLN
INTRAMUSCULAR | Status: DC | PRN
  Administered 2016-08-27: 30 mg via INTRAVENOUS

## 2016-08-27 MED ORDER — IOPAMIDOL (ISOVUE-300) INJECTION 61%
INTRAVENOUS | Status: DC | PRN
  Administered 2016-08-27: 5 mL

## 2016-08-27 MED ORDER — CEFAZOLIN SODIUM-DEXTROSE 2-4 GM/100ML-% IV SOLN
INTRAVENOUS | Status: AC
  Filled 2016-08-27: qty 100

## 2016-08-27 MED ORDER — FENTANYL CITRATE (PF) 100 MCG/2ML IJ SOLN
INTRAMUSCULAR | Status: DC | PRN
  Administered 2016-08-27: 100 ug via INTRAVENOUS
  Administered 2016-08-27 (×2): 25 ug via INTRAVENOUS

## 2016-08-27 MED ORDER — DEXAMETHASONE SODIUM PHOSPHATE 10 MG/ML IJ SOLN
INTRAMUSCULAR | Status: AC
  Filled 2016-08-27: qty 1

## 2016-08-27 MED ORDER — DEXAMETHASONE SODIUM PHOSPHATE 4 MG/ML IJ SOLN
INTRAMUSCULAR | Status: DC | PRN
  Administered 2016-08-27: 10 mg via INTRAVENOUS

## 2016-08-27 MED ORDER — PHENYLEPHRINE 40 MCG/ML (10ML) SYRINGE FOR IV PUSH (FOR BLOOD PRESSURE SUPPORT)
PREFILLED_SYRINGE | INTRAVENOUS | Status: AC
  Filled 2016-08-27: qty 10

## 2016-08-27 MED ORDER — BACITRACIN-NEOMYCIN-POLYMYXIN 400-5-5000 EX OINT
TOPICAL_OINTMENT | CUTANEOUS | Status: DC | PRN
  Administered 2016-08-27: 1 via TOPICAL

## 2016-08-27 MED ORDER — PROMETHAZINE HCL 25 MG/ML IJ SOLN: 6.2500 mg | INTRAMUSCULAR | Status: DC | PRN

## 2016-08-27 MED ORDER — EPHEDRINE 5 MG/ML INJ
INTRAVENOUS | Status: AC
  Filled 2016-08-27: qty 10

## 2016-08-27 MED ORDER — HYDROCODONE-ACETAMINOPHEN 5-325 MG PO TABS: 1.0000 | ORAL_TABLET | Freq: Four times a day (QID) | ORAL | 0 refills | Status: DC | PRN

## 2016-08-27 MED ORDER — FENTANYL CITRATE (PF) 100 MCG/2ML IJ SOLN: 50.0000 ug | INTRAMUSCULAR | Status: DC | PRN

## 2016-08-27 MED ORDER — EPHEDRINE SULFATE 50 MG/ML IJ SOLN
INTRAMUSCULAR | Status: DC | PRN
  Administered 2016-08-27: 10 mg via INTRAVENOUS

## 2016-08-27 SURGICAL SUPPLY — 68 items
APPLICATOR COTTON TIP 6IN STRL (MISCELLANEOUS) IMPLANT
BANDAGE ACE 6X5 VEL STRL LF (GAUZE/BANDAGES/DRESSINGS) IMPLANT
BENZOIN TINCTURE PRP APPL 2/3 (GAUZE/BANDAGES/DRESSINGS) IMPLANT
BINDER BREAST LRG (GAUZE/BANDAGES/DRESSINGS) IMPLANT
BINDER BREAST MEDIUM (GAUZE/BANDAGES/DRESSINGS) IMPLANT
BINDER BREAST XLRG (GAUZE/BANDAGES/DRESSINGS) IMPLANT
BINDER BREAST XXLRG (GAUZE/BANDAGES/DRESSINGS) ×2 IMPLANT
BLADE HEX COATED 2.75 (ELECTRODE) ×2 IMPLANT
BLADE SURG 15 STRL LF DISP TIS (BLADE) ×1 IMPLANT
BLADE SURG 15 STRL SS (BLADE) ×1
CANISTER SUC SOCK COL 7IN (MISCELLANEOUS) IMPLANT
CANISTER SUCT 1200ML W/VALVE (MISCELLANEOUS) IMPLANT
CHLORAPREP W/TINT 26ML (MISCELLANEOUS) ×2 IMPLANT
CLIP TI MEDIUM 6 (CLIP) IMPLANT
CLIP TI WIDE RED SMALL 6 (CLIP) ×2 IMPLANT
COVER BACK TABLE 60X90IN (DRAPES) ×2 IMPLANT
COVER MAYO STAND STRL (DRAPES) ×2 IMPLANT
COVER PROBE 5X48 (MISCELLANEOUS) ×1
COVER PROBE W GEL 5X96 (DRAPES) ×2 IMPLANT
DECANTER SPIKE VIAL GLASS SM (MISCELLANEOUS) ×2 IMPLANT
DERMABOND ADVANCED (GAUZE/BANDAGES/DRESSINGS) ×1
DERMABOND ADVANCED .7 DNX12 (GAUZE/BANDAGES/DRESSINGS) ×1 IMPLANT
DEVICE CAVITY EVALUATION 9031 (MISCELLANEOUS) ×2 IMPLANT
DEVICE DUBIN W/COMP PLATE 8390 (MISCELLANEOUS) ×2 IMPLANT
DRAPE LAPAROSCOPIC ABDOMINAL (DRAPES) ×2 IMPLANT
DRAPE LAPAROTOMY TRNSV 102X78 (DRAPE) ×2 IMPLANT
DRAPE UTILITY XL STRL (DRAPES) ×2 IMPLANT
DRSG TEGADERM 4X4.75 (GAUZE/BANDAGES/DRESSINGS) ×2 IMPLANT
ELECT BLADE 4.0 EZ CLEAN MEGAD (MISCELLANEOUS) ×2
ELECT COATED BLADE 2.86 ST (ELECTRODE) ×2 IMPLANT
ELECT REM PT RETURN 9FT ADLT (ELECTROSURGICAL) ×2
ELECTRODE BLDE 4.0 EZ CLN MEGD (MISCELLANEOUS) ×1 IMPLANT
ELECTRODE REM PT RTRN 9FT ADLT (ELECTROSURGICAL) ×1 IMPLANT
GAUZE SPONGE 4X4 12PLY STRL (GAUZE/BANDAGES/DRESSINGS) IMPLANT
GLOVE BIO SURGEON STRL SZ7 (GLOVE) ×6 IMPLANT
GLOVE BIOGEL PI IND STRL 7.5 (GLOVE) ×2 IMPLANT
GLOVE BIOGEL PI INDICATOR 7.5 (GLOVE) ×2
GOWN STRL REUS W/ TWL LRG LVL3 (GOWN DISPOSABLE) ×2 IMPLANT
GOWN STRL REUS W/TWL LRG LVL3 (GOWN DISPOSABLE) ×2
ILLUMINATOR WAVEGUIDE N/F (MISCELLANEOUS) ×2 IMPLANT
KIT CVR 48X5XPRB PLUP LF (MISCELLANEOUS) ×1 IMPLANT
KIT MARKER MARGIN INK (KITS) ×2 IMPLANT
LIGHT WAVEGUIDE WIDE FLAT (MISCELLANEOUS) IMPLANT
NEEDLE HYPO 25X1 1.5 SAFETY (NEEDLE) ×2 IMPLANT
NS IRRIG 1000ML POUR BTL (IV SOLUTION) ×2 IMPLANT
PACK BASIN DAY SURGERY FS (CUSTOM PROCEDURE TRAY) ×2 IMPLANT
PEN SKIN MARKING BROAD TIP (MISCELLANEOUS) IMPLANT
PENCIL BUTTON HOLSTER BLD 10FT (ELECTRODE) ×2 IMPLANT
SLEEVE SCD COMPRESS KNEE MED (MISCELLANEOUS) ×2 IMPLANT
SPONGE GAUZE 2X2 8PLY STRL LF (GAUZE/BANDAGES/DRESSINGS) ×4 IMPLANT
SPONGE GAUZE 4X4 12PLY STER LF (GAUZE/BANDAGES/DRESSINGS) IMPLANT
SPONGE INTESTINAL PEANUT (DISPOSABLE) IMPLANT
SPONGE LAP 4X18 X RAY DECT (DISPOSABLE) ×4 IMPLANT
STRIP CLOSURE SKIN 1/2X4 (GAUZE/BANDAGES/DRESSINGS) ×2 IMPLANT
SUT MNCRL AB 4-0 PS2 18 (SUTURE) ×2 IMPLANT
SUT MON AB 5-0 PS2 18 (SUTURE) IMPLANT
SUT SILK 2 0 SH (SUTURE) ×2 IMPLANT
SUT VIC AB 2-0 SH 27 (SUTURE) ×1
SUT VIC AB 2-0 SH 27XBRD (SUTURE) ×1 IMPLANT
SUT VIC AB 3-0 SH 27 (SUTURE) ×1
SUT VIC AB 3-0 SH 27X BRD (SUTURE) ×1 IMPLANT
SUT VIC AB 5-0 PS2 18 (SUTURE) IMPLANT
SUT VICRYL 4-0 PS2 18IN ABS (SUTURE) ×2 IMPLANT
SYR CONTROL 10ML LL (SYRINGE) ×2 IMPLANT
TOWEL OR 17X24 6PK STRL BLUE (TOWEL DISPOSABLE) ×2 IMPLANT
TOWEL OR NON WOVEN STRL DISP B (DISPOSABLE) IMPLANT
TUBE CONNECTING 20X1/4 (TUBING) IMPLANT
YANKAUER SUCT BULB TIP NO VENT (SUCTIONS) IMPLANT

## 2016-08-27 NOTE — H&P (Signed)
[age] yof referred by Dr Annye Asa presents after undergoing screening mm that shows left breast asymmetry that measures 10x4 mm. this is low grade dcis that is er/pr positive. she has no prior breast history and family history in her mom at age 68. she has no mass or nipple discharge  Other Problems  Breast Cancer Cholelithiasis General anesthesia - complications Lump In Breast Thyroid Disease  Past Surgical History  Breast Biopsy Right. Oral Surgery  Diagnostic Studies History  Colonoscopy never Mammogram within last year Pap Smear >5 years ago  Medication History  No Current Medications Medications Reconciled  Social History  No alcohol use No caffeine use No drug use Tobacco use Never smoker.  Family History  Alcohol Abuse Father. Arthritis Mother. Breast Cancer Mother. Thyroid problems Daughter.  Pregnancy / Birth History Age at menarche 68 years. Age of menopause 51-55 Contraceptive History Intrauterine device, Oral contraceptives. Gravida 5 Irregular periods Length (months) of breastfeeding 3-6 Maternal age 73-20 Para 5  Review of Systems  General Not Present- Appetite Loss, Chills, Fatigue, Fever, Night Sweats, Weight Gain and Weight Loss. Skin Not Present- Change in Wart/Mole, Dryness, Hives, Jaundice, New Lesions, Non-Healing Wounds, Rash and Ulcer. HEENT Present- Visual Disturbances and Wears glasses/contact lenses. Not Present- Earache, Hearing Loss, Hoarseness, Nose Bleed, Oral Ulcers, Ringing in the Ears, Seasonal Allergies, Sinus Pain, Sore Throat and Yellow Eyes. Respiratory Present- Snoring. Not Present- Bloody sputum, Chronic Cough, Difficulty Breathing and Wheezing. Breast Not Present- Breast Mass, Breast Pain, Nipple Discharge and Skin Changes. Cardiovascular Not Present- Chest Pain, Difficulty Breathing Lying Down, Leg Cramps, Palpitations, Rapid Heart Rate, Shortness of Breath and Swelling of  Extremities. Gastrointestinal Present- Indigestion. Not Present- Abdominal Pain, Bloating, Bloody Stool, Change in Bowel Habits, Chronic diarrhea, Constipation, Difficulty Swallowing, Excessive gas, Gets full quickly at meals, Hemorrhoids, Nausea, Rectal Pain and Vomiting. Female Genitourinary Not Present- Frequency, Nocturia, Painful Urination, Pelvic Pain and Urgency. Musculoskeletal Not Present- Back Pain, Joint Pain, Joint Stiffness, Muscle Pain, Muscle Weakness and Swelling of Extremities. Neurological Not Present- Decreased Memory, Fainting, Headaches, Numbness, Seizures, Tingling, Tremor, Trouble walking and Weakness. Psychiatric Not Present- Anxiety, Bipolar, Change in Sleep Pattern, Depression, Fearful and Frequent crying. Endocrine Not Present- Cold Intolerance, Excessive Hunger, Hair Changes, Heat Intolerance, Hot flashes and New Diabetes. Hematology Not Present- Blood Thinners, Easy Bruising, Excessive bleeding, Gland problems, HIV and Persistent Infections.   Physical Exam  General Mental Status-Alert. Orientation-Oriented X3. Chest and Lung Exam Chest and lung exam reveals -on auscultation, normal breath sounds, no adventitious sounds and normal vocal resonance. Breast Nipples-No Discharge. Breast Lump-No Palpable Breast Mass. Cardiovascular Cardiovascular examination reveals -normal heart sounds, regular rate and rhythm with no murmurs. Lymphatic Head & Neck General Head & Neck Lymphatics: Bilateral - Description - Normal. Axillary General Axillary Region: Bilateral - Description - Normal. Note: no [place] adenopathy   Assessment & Plan  BREAST NEOPLASM, TIS (DCIS), RIGHT (D05.11) Story: Right breast seed guided lumpectomy, mammosite placement We discussed the staging and pathophysiology of breast cancer. We discussed all of the different options for treatment for breast cancer including surgery, She does not need sentinel node biopsy as she has small area of  low grade dcis. We discussed the options for treatment of the breast cancer which included lumpectomy versus a mastectomy. We discussed the performance of the lumpectomy with radioactive seed placement. We discussed a 5% chance of a positive margin requiring reexcision in the operating room. We discussed the mastectomy (removal of whole breast) and the postoperative care for  that as well. Mastectomy can be followed by reconstruction. This is a more extensive surgery and requires more recovery. Most mastectomy patients will not need radiation therapy. We discussed that there is no difference in her survival whether she undergoes lumpectomy or mastectomy. I do think she will likely be a candidate for mammosite radiation therapy so will plan for spacer placement at time of surgery. This will be pending final path and we discussed changing balloon out after surgery if path is ok. will schedule for surgery

## 2016-08-27 NOTE — Anesthesia Postprocedure Evaluation (Signed)
Anesthesia Post Note  Patient: Mary Ochoa  Procedure(s) Performed: Procedure(s) (LRB): RIGHT BREAST LUMPECTOMY WITH RADIOACTIVE SEED LOCALIZATION (Right) PLACEMENT OF MAMMOSITE SPACER (Right)  Patient location during evaluation: PACU Anesthesia Type: General Level of consciousness: awake and alert Pain management: pain level controlled Vital Signs Assessment: post-procedure vital signs reviewed and stable Respiratory status: spontaneous breathing, nonlabored ventilation, respiratory function stable and patient connected to nasal cannula oxygen Cardiovascular status: blood pressure returned to baseline and stable Postop Assessment: no signs of nausea or vomiting Anesthetic complications: no    Last Vitals:  Vitals:   08/27/16 1200 08/27/16 1223  BP: 130/77 (!) 152/78  Pulse: 68 70  Resp: 16 18  Temp:  37.1 C    Last Pain:  Vitals:   08/27/16 1227  TempSrc:   PainSc: 2                  Tiajuana Amass

## 2016-08-27 NOTE — Transfer of Care (Signed)
Immediate Anesthesia Transfer of Care Note  Patient: Mary Ochoa  Procedure(s) Performed: Procedure(s): RIGHT BREAST LUMPECTOMY WITH RADIOACTIVE SEED LOCALIZATION (Right) PLACEMENT OF MAMMOSITE SPACER (Right)  Patient Location: PACU  Anesthesia Type:General  Level of Consciousness: sedated  Airway & Oxygen Therapy: Patient Spontanous Breathing and Patient connected to face mask oxygen  Post-op Assessment: Report given to RN and Post -op Vital signs reviewed and stable  Post vital signs: Reviewed and stable  Last Vitals:  Vitals:   08/27/16 0819  BP: (!) 168/79  Pulse: 86  Resp: 20  Temp: 36.5 C    Last Pain:  Vitals:   08/27/16 0819  TempSrc: Oral         Complications: No apparent anesthesia complications

## 2016-08-27 NOTE — Op Note (Signed)
Preoperative diagnosis: Stage 0 right breast cancer Postoperative diagnosis: Same as above Procedure: #1 right breast radioactive seed guided lumpectomy #2 placement of MammoSite cavity evaluation device Surgeon: Dr. Serita Grammes Anesthesia: Gen. Specimens: #1 right breast lumpectomy containing clip and seed marked with paint #2 additional anterior margin including skin marked short stitch appeared, long stitch lateral, double stitch deep #3 additional medial margin marked in a similar fashion Estimated blood loss: Minimal Drains: None Complications: None Sponge and needle count was correct at completion Description to recovery in stable condition  Indications: This is a 70 year old female who underwent screening mammography was found to have a right breast abnormality. 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 We discussed all of her options at her multidisciplinary conference and she has elected to proceed with lumpectomy and if pathology permits MammoSite targeted radiation therapy.  Procedure: After informed consent was obtained the patient first underwent radioactive seed placement at the breast center. I had these mammograms in the operating room. She was given antibiotics. Sequential compression devices were on her legs. She was placed under general anesthesia without complication. Her right breast was prepped and draped in the standard sterile surgical fashion. Surgical timeout was then performed.  In an effort to use the MammoSite device I made an elliptical incision overlying the radioactive seed in the right upper outer quadrant. I removed a small area of skin. I then tunneled to the lesion. I then removed the seed and the surrounding tissue with an attempt to get a clear margin. This was then painted. This was passed off the table and a mammogram was performed. This contained the radioactive seed as well as the clip. I thought my  medial margin might have been close so I did excise more. The anterior margin was also excised again. All other margins clinically looked like they were clear. Hemostasis was then obtained in the cavity. I closed the portion of the cavity with 2-0 Vicryl sutures. I then made a stab incision in the lateral right breast. I use a tonsil to create a tract. Once I had done my quality check on the cavity evaluation device and it was functional and then placed it through the tract and into the lumpectomy cavity. I filled this up with a mixture of contrast as well as saline and the ratio of 5 mL of contrast under 120 mL of saline. I put 40 mL in this and this appeared to fill the cavity well. I then deflated the balloon. I then closed the breast tissue with 2-0 Vicryl 3-0 Vicryl and 4-0 Monocryl. I then reinflated the ballon with 40 cc. Ultrasound was performed that showed I had over a centimeter distance from my skin to the balloon. The balloon also appeared in good position. There did not appear to be any air in the areas I can identify. I then placed Dermabond over the incision. I placed Steri-Strips. I placed bacitracin and a dressing over the CED site. A binder was placed. She tolerated this well as expected and transferred to the recovery room in stable condition.

## 2016-08-27 NOTE — Telephone Encounter (Signed)
Received message from [facility] asking about whether Mary Ochoa will have 2 CT Sim appointments.  Called her back and advised her that Mary Ochoa will have a [facility] tomorrow with the spacer in and then again on Friday after the mammosite balloon is placed.  Kolleen verbalized understanding and agreement.

## 2016-08-27 NOTE — Discharge Instructions (Signed)
[facility] °Office Phone Number [phone] ° °POST OP INSTRUCTIONS ° °Always review your discharge instruction sheet given to you by the facility where your surgery was performed. ° °IF YOU HAVE DISABILITY OR FAMILY LEAVE FORMS, YOU MUST BRING THEM TO THE OFFICE FOR PROCESSING.  DO NOT GIVE THEM TO YOUR DOCTOR. ° °1. A prescription for pain medication may be given to you upon discharge.  Take your pain medication as prescribed, if needed.  If narcotic pain medicine is not needed, then you may take acetaminophen (Tylenol), naprosyn (Alleve) or ibuprofen (Advil) as needed. °2. Take your usually prescribed medications unless otherwise directed °3. If you need a refill on your pain medication, please contact your pharmacy.  They will contact our office to request authorization.  Prescriptions will not be filled after 5pm or on week-ends. °4. You should eat very light the first 24 hours after surgery, such as soup, crackers, pudding, etc.  Resume your normal diet the day after surgery. °5. Most patients will experience some swelling and bruising in the breast.  Ice packs and a good support bra will help.  Wear the breast binder provided or a sports bra for 72 hours day and night.  After that wear a sports bra during the day until you return to the office. Swelling and bruising can take several days to resolve.  °6. It is common to experience some constipation if taking pain medication after surgery.  Increasing fluid intake and taking a stool softener will usually help or prevent this problem from occurring.  A mild laxative (Milk of Magnesia or Miralax) should be taken according to package directions if there are no bowel movements after 48 hours. °7. Unless discharge instructions indicate otherwise, you may remove your bandages 48 hours after surgery and you may shower at that time.  You may have steri-strips (small skin tapes) in place directly over the incision.  These strips should be left on the  skin for 7-10 days and will come off on their own.  If your surgeon used skin glue on the incision, you may shower in 24 hours.  The glue will flake off over the next 2-3 weeks.  Any sutures or staples will be removed at the office during your follow-up visit. °8. ACTIVITIES:  You may resume regular daily activities (gradually increasing) beginning the next day.  Wearing a good support bra or sports bra minimizes pain and swelling.  You may have sexual intercourse when it is comfortable. °a. You may drive when you no longer are taking prescription pain medication, you can comfortably wear a seatbelt, and you can safely maneuver your car and apply brakes. °b. RETURN TO WORK:  ______________________________________________________________________________________ °9. You should see your doctor in the office for a follow-up appointment approximately two weeks after your surgery.  Your doctor’s nurse will typically make your follow-up appointment when she calls you with your pathology report.  Expect your pathology report 3-4 business days after your surgery.  You may call to check if you do not hear from us after three days. °10. OTHER INSTRUCTIONS: _______________________________________________________________________________________________ _____________________________________________________________________________________________________________________________________ °_____________________________________________________________________________________________________________________________________ °_____________________________________________________________________________________________________________________________________ ° °WHEN TO CALL DR WAKEFIELD: °1. Fever over 101.0 °2. Nausea and/or vomiting. °3. Extreme swelling or bruising. °4. Continued bleeding from incision. °5. Increased pain, redness, or drainage from the incision. ° °The clinic staff is available to answer your questions during regular  business hours.  Please don’t hesitate to call and ask to speak to one of the nurses for clinical concerns.  If   you have a medical emergency, go to the nearest emergency room or call 911.  A surgeon from [facility] is always on call at the hospital. ° °For further questions, please visit [email or web address] ° ° ° °Post Anesthesia Home Care Instructions ° °Activity: °Get plenty of rest for the remainder of the day. A responsible adult should stay with you for 24 hours following the procedure.  °For the next 24 hours, DO NOT: °-Drive a car °-Operate machinery °-Drink alcoholic beverages °-Take any medication unless instructed by your physician °-Make any legal decisions or sign important papers. ° °Meals: °Start with liquid foods such as gelatin or soup. Progress to regular foods as tolerated. Avoid greasy, spicy, heavy foods. If nausea and/or vomiting occur, drink only clear liquids until the nausea and/or vomiting subsides. Call your physician if vomiting continues. ° °Special Instructions/Symptoms: °Your throat may feel dry or sore from the anesthesia or the breathing tube placed in your throat during surgery. If this causes discomfort, gargle with warm salt water. The discomfort should disappear within 24 hours. ° °If you had a scopolamine patch placed behind your ear for the management of post- operative nausea and/or vomiting: ° °1. The medication in the patch is effective for 72 hours, after which it should be removed.  Wrap patch in a tissue and discard in the trash. Wash hands thoroughly with soap and water. °2. You may remove the patch earlier than 72 hours if you experience unpleasant side effects which may include dry mouth, dizziness or visual disturbances. °3. Avoid touching the patch. Wash your hands with soap and water after contact with the patch. °  ° °

## 2016-08-27 NOTE — Anesthesia Preprocedure Evaluation (Signed)
Anesthesia Evaluation  Patient identified by MRN, date of birth, ID band Patient awake    Reviewed: Allergy & Precautions, NPO status , Patient's Chart, lab work & pertinent test results  History of Anesthesia Complications (+) PONV  Airway Mallampati: II  TM Distance: >3 FB Neck ROM: Full    Dental  (+) Dental Advisory Given   Pulmonary neg pulmonary ROS,    breath sounds clear to auscultation       Cardiovascular negative cardio ROS   Rhythm:Regular Rate:Normal     Neuro/Psych negative neurological ROS     GI/Hepatic negative GI ROS, Neg liver ROS,   Endo/Other  negative endocrine ROS  Renal/GU negative Renal ROS     Musculoskeletal   Abdominal   Peds  Hematology negative hematology ROS (+)   Anesthesia Other Findings   Reproductive/Obstetrics                             Anesthesia Physical Anesthesia Plan  ASA: II  Anesthesia Plan: General   Post-op Pain Management:    Induction: Intravenous  Airway Management Planned: LMA  Additional Equipment:   Intra-op Plan:   Post-operative Plan: Extubation in OR  Informed Consent: I have reviewed the patients History and Physical, chart, labs and discussed the procedure including the risks, benefits and alternatives for the proposed anesthesia with the patient or authorized representative who has indicated his/her understanding and acceptance.   Dental advisory given  Plan Discussed with: CRNA  Anesthesia Plan Comments:         Anesthesia Quick Evaluation

## 2016-08-27 NOTE — Anesthesia Procedure Notes (Signed)
Procedure Name: LMA Insertion Date/Time: 08/27/2016 10:00 AM Performed by: Toula Moos L Pre-anesthesia Checklist: Patient identified, Emergency Drugs available, Suction available, Patient being monitored and Timeout performed Patient Re-evaluated:Patient Re-evaluated prior to inductionOxygen Delivery Method: Circle system utilized Preoxygenation: Pre-oxygenation with 100% oxygen Intubation Type: IV induction Ventilation: Mask ventilation without difficulty LMA: LMA inserted LMA Size: 4.0 Number of attempts: 1 Airway Equipment and Method: Bite block Placement Confirmation: positive ETCO2 Tube secured with: Tape Dental Injury: Teeth and Oropharynx as per pre-operative assessment

## 2016-08-27 NOTE — Interval H&P Note (Signed)
History and Physical Interval Note:  08/27/2016 9:38 AM  Mary Ochoa  has presented today for surgery, with the diagnosis of RIGHT BREAST CANCER  The various methods of treatment have been discussed with the patient and family. After consideration of risks, benefits and other options for treatment, the patient has consented to  Procedure(s): RIGHT BREAST LUMPECTOMY WITH RADIOACTIVE SEED LOCALIZATION (Right) PLACEMENT OF MAMMOSITE SPACER (N/A) as a surgical intervention .  The patient's history has been reviewed, patient examined, no change in status, stable for surgery.  I have reviewed the patient's chart and labs.  Questions were answered to the patient's satisfaction.     Guiselle Mian

## 2016-08-28 ENCOUNTER — Ambulatory Visit
Admission: RE | Admit: 2016-08-28 | Discharge: 2016-08-28 | Disposition: A | Discharge status: Home / Self Care | Payer: Medicare HMO | Source: Ambulatory Visit | Attending: Radiation Oncology | Admitting: Radiation Oncology

## 2016-08-28 ENCOUNTER — Encounter: Payer: Self-pay | Admitting: Radiation Oncology

## 2016-08-28 VITALS — BP 129/61 | HR 53 | Temp 98.4°F

## 2016-08-28 DIAGNOSIS — Z170 Estrogen receptor positive status [ER+]: Secondary | ICD-10-CM | POA: Insufficient documentation

## 2016-08-28 DIAGNOSIS — Z510 Encounter for antineoplastic radiation therapy: Secondary | ICD-10-CM | POA: Insufficient documentation

## 2016-08-28 DIAGNOSIS — C50411 Malignant neoplasm of upper-outer quadrant of right female breast: Secondary | ICD-10-CM | POA: Insufficient documentation

## 2016-08-28 NOTE — Progress Notes (Signed)
  Radiation Oncology         [phone]) [phone] ________________________________  Name: Mary Ochoa MRN: [id number]  Date: 08/28/2016  DOB: [birth year]  SIMULATION AND TREATMENT PLANNING NOTE    ICD-9-CM ICD-10-CM   1. Malignant neoplasm of upper-outer quadrant of right breast in female, estrogen receptor positive ([place]) 174.4 C50.411    V86.0 Z17.0     DIAGNOSIS:  Intraductal carcinoma the right breast  NARRATIVE:  The patient was brought to the [place].  Identity was confirmed.  All relevant records and images related to the planned course of therapy were reviewed.  The patient freely provided informed written consent to proceed with treatment after reviewing the details related to the planned course of therapy. The consent form was witnessed and verified by the simulation staff.  Then, the patient was set-up in a stable reproducible  supine position for radiation therapy.  CT images were obtained.  the cavity evaluation device (CEV) was noted in the central portion of the upper outer aspect of the breast. There was a good distance from the chest wall and skin. There were no significant areas of air pockets around the balloon. Patient would appear to be an excellent candidate for partial breast irradiation therapy using the MammoSite catheter system.  PLAN:  Patient will present to Dr. Cristal Generous office Friday morning for exchange of the cavity evaluation device for the patient's MammoSite treatment balloon. Patient then will return to radiation oncology on Friday, December 8 at 2 PM for CTplanning for her accelerated partial breast radiation therapy Using iridium 192 as the high-dose-rate source. She will receive 34 gray in 10 fractions over 5 days (  twice a day treatments). Treatments will begin December 11 with catheter/balloon system removal on December 15 after her 10th treatment.   -----------------------------------   Blair Promise, PhD, MD

## 2016-08-30 ENCOUNTER — Other Ambulatory Visit: Payer: Self-pay | Admitting: Radiation Oncology

## 2016-08-31 ENCOUNTER — Telehealth: Payer: Self-pay | Admitting: Oncology

## 2016-08-31 ENCOUNTER — Ambulatory Visit
Admission: RE | Admit: 2016-08-31 | Discharge: 2016-08-31 | Disposition: A | Discharge status: Home / Self Care | Payer: Medicare HMO | Source: Ambulatory Visit | Attending: Radiation Oncology | Admitting: Radiation Oncology

## 2016-08-31 DIAGNOSIS — C50411 Malignant neoplasm of upper-outer quadrant of right female breast: Secondary | ICD-10-CM | POA: Diagnosis present

## 2016-08-31 DIAGNOSIS — Z510 Encounter for antineoplastic radiation therapy: Secondary | ICD-10-CM | POA: Diagnosis present

## 2016-08-31 DIAGNOSIS — Z170 Estrogen receptor positive status [ER+]: Secondary | ICD-10-CM | POA: Diagnosis not present

## 2016-08-31 DIAGNOSIS — D0511 Intraductal carcinoma in situ of right breast: Secondary | ICD-10-CM | POA: Diagnosis not present

## 2016-08-31 NOTE — Telephone Encounter (Addendum)
Called Dr. Cristal Generous office and talked to his nurse Elmo Putt about the wire in patient's mammosite catheter lumen.  She contacted Dr. Donne Hazel who said to leave the wire out of her mammosite catheter.  He said there should be a cap hanging down to close the catheter.  Notified Candace, RT with CT SIM.

## 2016-08-31 NOTE — Progress Notes (Signed)
Patient reports mammosite has been draining.  Small amount of sanguinous drainage noted.  Cleansed insertion site for mammosite with sterile water.  Dried area with sterile 4x4.  Applied triple antibiotic ointment around site with sterile q-tips.  Covered with sterile drain sponges and 4x4 guaze.  Covered area with abd pad and secured with medipore tape.  Gave patient dressing supplies to change over the weekend.  Also provided her with the [facility] handout for dressing care.  Advised patient to call on call doctor over the weekend if she has any issues.

## 2016-09-03 ENCOUNTER — Ambulatory Visit
Admission: RE | Admit: 2016-09-03 | Discharge: 2016-09-03 | Disposition: A | Discharge status: Home / Self Care | Payer: Medicare HMO | Source: Ambulatory Visit | Attending: Radiation Oncology | Admitting: Radiation Oncology

## 2016-09-03 DIAGNOSIS — C50411 Malignant neoplasm of upper-outer quadrant of right female breast: Secondary | ICD-10-CM | POA: Diagnosis not present

## 2016-09-03 DIAGNOSIS — Z170 Estrogen receptor positive status [ER+]: Principal | ICD-10-CM

## 2016-09-03 DIAGNOSIS — Z510 Encounter for antineoplastic radiation therapy: Secondary | ICD-10-CM | POA: Diagnosis not present

## 2016-09-03 NOTE — Progress Notes (Signed)
   Department of Radiation Oncology  Phone:  [phone] Fax:        [phone] ________________________________  Theophilus Kinds MRN: [id number]  Date: 09/03/2016  DOB: May 20, 1946  HDR MammoSite Brachytherapy Procedure Note   DIAGNOSIS: Breast cancer.   SITE: Right breast  CURRENT FRACTION: 2  PLANNED FRACTION: 10   SIMULATION VERIFICATION: An image was obtained to verify device position and geometry. The balloon symmetry was verified. The balloon inflation was checked. After careful review, I approved the simulation film.   NARRATIVE: All involved HDR brachytherapy afterloader catheters were connected to the MammoSite device. All catheter numbers and catheter connections were checked by physics and me. The brachytherapy treatment was delivered under my personal supervision uneventfully. Then the transfer tubing was disconnected and the cap placed on the catheter.   DOSIMETRY: Based on the current source strength, 408.2 seconds was calculated to deliver the planned dose of 3.4 Gy.   DISPOSITION: Treatment was delivered uneventfully. Survey of the room after treatment confirmed that the radioactive source returned to the afterloader. The dressing was re-applied with assistance from the nursing staff. The patient tolerated treatment well and will return as scheduled for their next fraction and/or follow up visit.   This document serves as a record of services personally performed by Gery Pray, MD. It was created on his behalf by Bethann Humble, a trained medical scribe. The creation of this record is based on the scribe's personal observations and the provider's statements to them. This document has been checked and approved by the attending provider.

## 2016-09-03 NOTE — Progress Notes (Signed)
  Radiation Oncology         [phone] ________________________________  Name: Mary Ochoa MRN: [id number]  Date: 09/03/2016  DOB: [birth year]  Simulation Verification Note    ICD-9-CM ICD-10-CM   1. Malignant neoplasm of upper-outer quadrant of right breast in female, estrogen receptor positive ([place]) 174.4 C50.411    V86.0 Z17.0     Status: outpatient  NARRATIVE: The patient was brought to the treatment unit and placed in the planned treatment position. The clinical setup was verified. Then port films were obtained and uploaded to the radiation oncology medical record software.  The treatment beams were carefully compared against the planned radiation fields. The position location and shape of the radiation fields was reviewed. They targeted volume of tissue appears to be appropriately covered by the radiation beams. Organs at risk appear to be excluded as planned.  Based on my personal review, I approved the simulation verification. The patient's treatment will proceed as planned.  -----------------------------------  Blair Promise, PhD, MD

## 2016-09-03 NOTE — Progress Notes (Signed)
   Department of Radiation Oncology  Phone:  [phone] Fax:        [phone] ________________________________  Theophilus Kinds MRN: [id number]  Date: 09/03/2016  DOB: May 27, 1946  HDR MammoSite Brachytherapy Procedure Note   DIAGNOSIS: Breast cancer.   SITE: Right breast  CURRENT FRACTION: 1  PLANNED FRACTION: 10   SIMULATION VERIFICATION: An image was obtained to verify device position and geometry. The balloon symmetry was verified. The balloon inflation was checked. After careful review, I approved the simulation film.   NARRATIVE: All involved HDR brachytherapy afterloader catheters were connected to the MammoSite device. All catheter numbers and catheter connections were checked by physics and me. The brachytherapy treatment was delivered under my personal supervision uneventfully. Then the transfer tubing was disconnected and the cap placed on the catheter.   DOSIMETRY: Based on the current source strength, 408.2 seconds was calculated to deliver the planned dose of 3.4 Gy.   DISPOSITION: Treatment was delivered uneventfully. Survey of the room after treatment confirmed that the radioactive source returned to the afterloader. The dressing was re-applied with assistance from the nursing staff. The patient tolerated treatment well and will return as scheduled for their next fraction and/or follow up visit.   This document serves as a record of services personally performed by Gery Pray, MD. It was created on his behalf by Bethann Humble, a trained medical scribe. The creation of this record is based on the scribe's personal observations and the provider's statements to them. This document has been checked and approved by the attending provider.

## 2016-09-03 NOTE — Progress Notes (Signed)
  Radiation Oncology         (336) [phone] ________________________________  Name: Mary Ochoa MRN: [id number]  Date: 09/03/2016  DOB: Nov 16, 1945  Simulation Verification Note    ICD-9-CM ICD-10-CM   1. Malignant neoplasm of upper-outer quadrant of right breast in female, estrogen receptor positive ([place]) 174.4 C50.411    V86.0 Z17.0     Status: outpatient  NARRATIVE: The patient was brought to the treatment unit and placed in the planned treatment position. The clinical setup was verified. Then port films were obtained and uploaded to the radiation oncology medical record software.  The treatment beams were carefully compared against the planned radiation fields. The position location and shape of the radiation fields was reviewed. They targeted volume of tissue appears to be appropriately covered by the radiation beams. Organs at risk appear to be excluded as planned.  Based on my personal review, I approved the simulation verification. The patient's treatment will proceed as planned.  -----------------------------------  Blair Promise, PhD, MD

## 2016-09-03 NOTE — Progress Notes (Signed)
Cleansed right breast insertion site for mammosite with sterile water.  Dried area with sterile 4x4.  Applied triple antibiotic ointment around site with sterile q-tips.  Covered with sterile drain sponges and 4x4 guaze.  Covered area with abd pad and secured with mesh top.

## 2016-09-04 ENCOUNTER — Ambulatory Visit
Admission: RE | Admit: 2016-09-04 | Discharge: 2016-09-04 | Disposition: A | Discharge status: Home / Self Care | Payer: Medicare HMO | Source: Ambulatory Visit | Attending: Radiation Oncology | Admitting: Radiation Oncology

## 2016-09-04 ENCOUNTER — Encounter: Payer: Self-pay | Admitting: Radiation Oncology

## 2016-09-04 DIAGNOSIS — C50411 Malignant neoplasm of upper-outer quadrant of right female breast: Secondary | ICD-10-CM

## 2016-09-04 DIAGNOSIS — Z510 Encounter for antineoplastic radiation therapy: Secondary | ICD-10-CM | POA: Diagnosis not present

## 2016-09-04 DIAGNOSIS — Z170 Estrogen receptor positive status [ER+]: Principal | ICD-10-CM

## 2016-09-04 NOTE — Progress Notes (Signed)
   Department of Radiation Oncology  Phone:  [phone] Fax:        [phone] ________________________________  Theophilus Kinds MRN: [id number]  Date: 09/04/2016  DOB: 1946/09/16  HDR MammoSite Brachytherapy Procedure Note   DIAGNOSIS: Breast cancer.   SITE: Right breast  CURRENT FRACTION: 4  PLANNED FRACTION: 10   SIMULATION VERIFICATION: An image was obtained to verify device position and geometry. The balloon symmetry was verified. The balloon inflation was checked. After careful review, I approved the simulation film.   NARRATIVE: All involved HDR brachytherapy afterloader catheters were connected to the MammoSite device. All catheter numbers and catheter connections were checked by physics and me. The brachytherapy treatment was delivered under my personal supervision uneventfully. Then the transfer tubing was disconnected and the cap placed on the catheter.   DOSIMETRY: Based on the current source strength, 412.0 seconds was calculated to deliver the planned dose of 3.4 Gy.   DISPOSITION: Treatment was delivered uneventfully. Survey of the room after treatment confirmed that the radioactive source returned to the afterloader. The dressing was re-applied with assistance from the nursing staff. The patient tolerated treatment well and will return as scheduled for their next fraction and/or follow up visit.   -----------------------------------  Blair Promise, PhD, MD  This document serves as a record of services personally performed by Gery Pray, MD. It was created on his behalf by Maryla Morrow, a trained medical scribe. The creation of this record is based on the scribe's personal observations and the provider's statements to them. This document has been checked and approved by the attending provider.

## 2016-09-04 NOTE — Progress Notes (Signed)
Dressing change mammosite right breast,  Removed old dressing 4 x 4  drainage moist,serous sanguis , cleansed area around catheter tubing  And in open skin with normal saline, then applied using q-tip with triple antibiotic in and around tubing site right breast,  , j/p drain under and over catheter tubing, abd dressing applied over site, and medipore tape slightly on skin, secured with mesh netting and tube top, d/c patient home 3:03 PM

## 2016-09-04 NOTE — Progress Notes (Signed)
  Radiation Oncology         [phone]) [phone] ________________________________  Name: Mary Ochoa MRN: [id number]  Date: 09/04/2016  DOB: 1945/10/08  Simulation Verification Note    ICD-9-CM ICD-10-CM   1. Malignant neoplasm of upper-outer quadrant of right breast in female, estrogen receptor positive ([place]) 174.4 C50.411    V86.0 Z17.0     Status: outpatient  NARRATIVE: The patient was brought to the treatment unit and placed in the planned treatment position. The clinical setup was verified. Then port films were obtained and uploaded to the radiation oncology medical record software.  The treatment beams were carefully compared against the planned radiation fields. The position location and shape of the radiation fields was reviewed. They targeted volume of tissue appears to be appropriately covered by the radiation beams. Organs at risk appear to be excluded as planned.  Based on my personal review, I approved the simulation verification. The patient's treatment will proceed as planned.  -----------------------------------  Blair Promise, PhD, MD

## 2016-09-04 NOTE — Progress Notes (Signed)
  Radiation Oncology         [phone] ________________________________  Name: Mary Ochoa MRN: [id number]  Date: 09/04/2016  DOB: 03/28/1946  Simulation Verification Note    ICD-9-CM ICD-10-CM   1. Malignant neoplasm of upper-outer quadrant of right breast in female, estrogen receptor positive ([facility]) 174.4 C50.411    V86.0 Z17.0     Status: outpatient  NARRATIVE: The patient was brought to the treatment unit and placed in the planned treatment position. The clinical setup was verified. Then port films were obtained and uploaded to the radiation oncology medical record software.  The treatment beams were carefully compared against the planned radiation fields. The position location and shape of the radiation fields was reviewed. They targeted volume of tissue appears to be appropriately covered by the radiation beams. Organs at risk appear to be excluded as planned.  Based on my personal review, I approved the simulation verification. The patient's treatment will proceed as planned.  -----------------------------------  Blair Promise, PhD, MD

## 2016-09-05 ENCOUNTER — Ambulatory Visit
Admission: RE | Admit: 2016-09-05 | Discharge: 2016-09-05 | Disposition: A | Discharge status: Home / Self Care | Payer: Medicare HMO | Source: Ambulatory Visit | Attending: Radiation Oncology | Admitting: Radiation Oncology

## 2016-09-05 DIAGNOSIS — C50411 Malignant neoplasm of upper-outer quadrant of right female breast: Secondary | ICD-10-CM

## 2016-09-05 DIAGNOSIS — Z510 Encounter for antineoplastic radiation therapy: Secondary | ICD-10-CM | POA: Diagnosis not present

## 2016-09-05 DIAGNOSIS — Z170 Estrogen receptor positive status [ER+]: Principal | ICD-10-CM

## 2016-09-05 NOTE — Progress Notes (Signed)
   Department of Radiation Oncology  Phone:  [phone] Fax:        [phone] ________________________________  Theophilus Kinds MRN: [id number]  Date: 09/05/2016  DOB: 1946/08/29  HDR MammoSite Brachytherapy Procedure Note   DIAGNOSIS: Breast cancer.   SITE: Right breast  CURRENT FRACTION: 6  PLANNED FRACTION: 10   SIMULATION VERIFICATION: An image was obtained to verify device position and geometry. The balloon symmetry was verified. The balloon inflation was checked. After careful review, I approved the simulation film.   NARRATIVE: All involved HDR brachytherapy afterloader catheters were connected to the MammoSite device. All catheter numbers and catheter connections were checked by physics and me. The brachytherapy treatment was delivered under my personal supervision uneventfully. Then the transfer tubing was disconnected and the cap placed on the catheter.   DOSIMETRY: Based on the current source strength, 416.0 seconds was calculated to deliver the planned dose of 3.4 Gy.   DISPOSITION: Treatment was delivered uneventfully. Survey of the room after treatment confirmed that the radioactive source returned to the afterloader. The dressing was re-applied with assistance from the nursing staff. The patient tolerated treatment well and will return as scheduled for their next fraction and/or follow up visit.   -----------------------------------  Blair Promise, PhD, MD  This document serves as a record of services personally performed by Gery Pray, MD. It was created on his behalf by Bethann Humble, a trained medical scribe. The creation of this record is based on the scribe's personal observations and the provider's statements to them. This document has been checked and approved by the attending provider.

## 2016-09-05 NOTE — Progress Notes (Signed)
  Radiation Oncology         (336) [phone] ________________________________  Name: Mary Ochoa MRN: [id number]  Date: 09/05/2016  DOB: [birth year]  Simulation Verification Note    ICD-9-CM ICD-10-CM   1. Malignant neoplasm of upper-outer quadrant of right breast in female, estrogen receptor positive ([place]) 174.4 C50.411    V86.0 Z17.0     Status: outpatient  NARRATIVE: The patient was brought to the treatment unit and placed in the planned treatment position. The clinical setup was verified. Then port films were obtained and uploaded to the radiation oncology medical record software.  The treatment beams were carefully compared against the planned radiation fields. The position location and shape of the radiation fields was reviewed. They targeted volume of tissue appears to be appropriately covered by the radiation beams. Organs at risk appear to be excluded as planned.  Based on my personal review, I approved the simulation verification. The patient's treatment will proceed as planned.  -----------------------------------  Blair Promise, PhD, MD  This document serves as a record of services personally performed by Gery Pray, MD. It was created on his behalf by Bethann Humble, a trained medical scribe. The creation of this record is based on the scribe's personal observations and the provider's statements to them. This document has been checked and approved by the attending provider.

## 2016-09-05 NOTE — Progress Notes (Signed)
   Department of Radiation Oncology  Phone:  [phone] Fax:        [phone] ________________________________  Theophilus Kinds MRN: [id number]  Date: 09/05/2016  DOB: [birth year]  HDR MammoSite Brachytherapy Procedure Note   DIAGNOSIS: Breast cancer.   SITE: Right breast  CURRENT FRACTION: 5  PLANNED FRACTION: 10   SIMULATION VERIFICATION: An image was obtained to verify device position and geometry. The balloon symmetry was verified. The balloon inflation was checked. After careful review, I approved the simulation film.   NARRATIVE: All involved HDR brachytherapy afterloader catheters were connected to the MammoSite device. All catheter numbers and catheter connections were checked by physics and me. The brachytherapy treatment was delivered under my personal supervision uneventfully. Then the transfer tubing was disconnected and the cap placed on the catheter.   DOSIMETRY: Based on the current source strength, 416.0 seconds was calculated to deliver the planned dose of 3.4 Gy.   DISPOSITION: Treatment was delivered uneventfully. Survey of the room after treatment confirmed that the radioactive source returned to the afterloader. The dressing was re-applied with assistance from the nursing staff. The patient tolerated treatment well and will return as scheduled for their next fraction and/or follow up visit.   -----------------------------------  Blair Promise, PhD, MD  This document serves as a record of services personally performed by Gery Pray, MD. It was created on his behalf by Bethann Humble, a trained medical scribe. The creation of this record is based on the scribe's personal observations and the provider's statements to them. This document has been checked and approved by the attending provider.

## 2016-09-05 NOTE — Progress Notes (Signed)
Mary Ochoa presented to nursing for her Mammosite dressing change after her treatment today. The dressing in place was removed. The incision site was slightly red, but no drainage present. The incision site was cleaned with saline and neosporin and a new drainage gauze was placed. The area was covered with an ABD pad and a minimal amount of tape applied. She is wearing a bra binder to assist in keeping the dressing in place. She voiced no complaints at this time, and knows to let us know if she has any questions or concerns.

## 2016-09-05 NOTE — Progress Notes (Signed)
  Radiation Oncology         (336) [phone] ________________________________  Name: Mary Ochoa MRN: [id number]  Date: 09/05/2016  DOB: [birth year]  Simulation Verification Note    ICD-9-CM ICD-10-CM   1. Malignant neoplasm of upper-outer quadrant of right breast in female, estrogen receptor positive ([place]) 174.4 C50.411    V86.0 Z17.0     Status: outpatient  NARRATIVE: The patient was brought to the treatment unit and placed in the planned treatment position. The clinical setup was verified. Then port films were obtained and uploaded to the radiation oncology medical record software.  The treatment beams were carefully compared against the planned radiation fields. The position location and shape of the radiation fields was reviewed. They targeted volume of tissue appears to be appropriately covered by the radiation beams. Organs at risk appear to be excluded as planned.  Based on my personal review, I approved the simulation verification. The patient's treatment will proceed as planned.  -----------------------------------  Blair Promise, PhD, MD  This document serves as a record of services personally performed by Gery Pray, MD. It was created on his behalf by Bethann Humble, a trained medical scribe. The creation of this record is based on the scribe's personal observations and the provider's statements to them. This document has been checked and approved by the attending provider.

## 2016-09-06 ENCOUNTER — Ambulatory Visit
Admission: RE | Admit: 2016-09-06 | Discharge: 2016-09-06 | Disposition: A | Discharge status: Home / Self Care | Payer: Medicare HMO | Source: Ambulatory Visit | Attending: Radiation Oncology | Admitting: Radiation Oncology

## 2016-09-06 VITALS — Temp 99.0°F

## 2016-09-06 DIAGNOSIS — Z170 Estrogen receptor positive status [ER+]: Principal | ICD-10-CM

## 2016-09-06 DIAGNOSIS — C50411 Malignant neoplasm of upper-outer quadrant of right female breast: Secondary | ICD-10-CM | POA: Diagnosis not present

## 2016-09-06 DIAGNOSIS — Z510 Encounter for antineoplastic radiation therapy: Secondary | ICD-10-CM | POA: Diagnosis not present

## 2016-09-06 NOTE — Progress Notes (Signed)
   Department of Radiation Oncology  Phone:  [phone] Fax:        [phone] ________________________________  Mary Ochoa MRN: [id number]  Date: 09/06/2016  DOB: 18-Feb-1946  HDR MammoSite Brachytherapy Procedure Note   DIAGNOSIS: Breast cancer.   SITE: Right breast  CURRENT FRACTION: 7  PLANNED FRACTION: 10   SIMULATION VERIFICATION: An image was obtained to verify device position and geometry. The balloon symmetry was verified. The balloon inflation was checked. After careful review, I approved the simulation film.   NARRATIVE: All involved HDR brachytherapy afterloader catheters were connected to the MammoSite device. All catheter numbers and catheter connections were checked by physics and me. The brachytherapy treatment was delivered under my personal supervision uneventfully. Then the transfer tubing was disconnected and the cap placed on the catheter.   DOSIMETRY: Based on the current source strength, 419.8 seconds was calculated to deliver the planned dose of 3.4 Gy.   DISPOSITION: Treatment was delivered uneventfully. Survey of the room after treatment confirmed that the radioactive source returned to the afterloader. The dressing was re-applied with assistance from the nursing staff. The patient tolerated treatment well and will return as scheduled for their next fraction and/or follow up visit.   -----------------------------------  Blair Promise, PhD, MD  This document serves as a record of services personally performed by Gery Pray, MD. It was created on his behalf by Bethann Humble, a trained medical scribe. The creation of this record is based on the scribe's personal observations and the provider's statements to them. This document has been checked and approved by the attending provider.

## 2016-09-06 NOTE — Progress Notes (Signed)
Patient here for dressing change.  The skin around the insertion site is red/pink.  Cleansed insertion with sterile normal saline.  Dried site with 4 x 4 and applied triple antibiotic.  Covered with drain sponges and 4x4's.  Secured with paper tape.

## 2016-09-06 NOTE — Progress Notes (Signed)
   Department of Radiation Oncology  Phone:  [phone] Fax:        [phone] ________________________________  Theophilus Kinds MRN: [id number]  Date: 09/06/2016  DOB: 1946-09-10  HDR MammoSite Brachytherapy Procedure Note   DIAGNOSIS: Breast cancer.   SITE: Right breast  CURRENT FRACTION: 8  PLANNED FRACTION: 10   SIMULATION VERIFICATION: An image was obtained to verify device position and geometry. The balloon symmetry was verified. The balloon inflation was checked. After careful review, I approved the simulation film.   NARRATIVE: All involved HDR brachytherapy afterloader catheters were connected to the MammoSite device. All catheter numbers and catheter connections were checked by physics and me. The brachytherapy treatment was delivered under my personal supervision uneventfully. Then the transfer tubing was disconnected and the cap placed on the catheter.   DOSIMETRY: Based on the current source strength, 419.8 seconds was calculated to deliver the planned dose of 3.4 Gy.   DISPOSITION: Treatment was delivered uneventfully. Survey of the room after treatment confirmed that the radioactive source returned to the afterloader. The dressing was re-applied with assistance from the nursing staff. The patient tolerated treatment well and will return as scheduled for their next fraction and/or follow up visit.   -----------------------------------  Blair Promise, PhD, MD  This document serves as a record of services personally performed by Gery Pray, MD. It was created on his behalf by Bethann Humble, a trained medical scribe. The creation of this record is based on the scribe's personal observations and the provider's statements to them. This document has been checked and approved by the attending provider.

## 2016-09-06 NOTE — Progress Notes (Signed)
  Radiation Oncology         [phone]) [phone] ________________________________  Name: Mary Ochoa MRN: [id number]  Date: 09/06/2016  DOB: 09-19-1946  Simulation Verification Note    ICD-9-CM ICD-10-CM   1. Malignant neoplasm of upper-outer quadrant of right breast in female, estrogen receptor positive ([place]) 174.4 C50.411    V86.0 Z17.0     Status: outpatient  NARRATIVE: The patient was brought to the treatment unit and placed in the planned treatment position. The clinical setup was verified. Then port films were obtained and uploaded to the radiation oncology medical record software.  The treatment beams were carefully compared against the planned radiation fields. The position location and shape of the radiation fields was reviewed. They targeted volume of tissue appears to be appropriately covered by the radiation beams. Organs at risk appear to be excluded as planned.  Based on my personal review, I approved the simulation verification. The patient's treatment will proceed as planned.  -----------------------------------  Blair Promise, PhD, MD  This document serves as a record of services personally performed by Gery Pray, MD. It was created on his behalf by Bethann Humble, a trained medical scribe. The creation of this record is based on the scribe's personal observations and the provider's statements to them. This document has been checked and approved by the attending provider.

## 2016-09-06 NOTE — Progress Notes (Addendum)
  Radiation Oncology         (336) [phone] ________________________________  Name: Mary Ochoa MRN: [id number]  Date: 09/06/2016  DOB: 22-Mar-1946  Simulation Verification Note    ICD-9-CM ICD-10-CM   1. Malignant neoplasm of upper-outer quadrant of right breast in female, estrogen receptor positive ([place]) 174.4 C50.411    V86.0 Z17.0     Status: outpatient  NARRATIVE: The patient was brought to the CT scanner room and placed in the planned treatment position. A limited CT scan was performed through the breast area.  The clinical setup was verified.   The mammosite balloon was expanded well and in good position. Patient will proceed with treatment later this morning. -----------------------------------  Blair Promise, PhD, MD  This document serves as a record of services personally performed by Gery Pray, MD. It was created on his behalf by Bethann Humble, a trained medical scribe. The creation of this record is based on the scribe's personal observations and the provider's statements to them. This document has been checked and approved by the attending provider.

## 2016-09-07 ENCOUNTER — Encounter: Payer: Self-pay | Admitting: Radiation Oncology

## 2016-09-07 ENCOUNTER — Ambulatory Visit
Admission: RE | Admit: 2016-09-07 | Discharge: 2016-09-07 | Disposition: A | Discharge status: Home / Self Care | Payer: Medicare HMO | Source: Ambulatory Visit | Attending: Radiation Oncology | Admitting: Radiation Oncology

## 2016-09-07 DIAGNOSIS — Z170 Estrogen receptor positive status [ER+]: Secondary | ICD-10-CM | POA: Diagnosis not present

## 2016-09-07 DIAGNOSIS — C50411 Malignant neoplasm of upper-outer quadrant of right female breast: Secondary | ICD-10-CM

## 2016-09-07 DIAGNOSIS — Z510 Encounter for antineoplastic radiation therapy: Secondary | ICD-10-CM | POA: Diagnosis not present

## 2016-09-07 NOTE — Progress Notes (Addendum)
   Department of Radiation Oncology  Phone:  [phone] Fax:        [phone] ________________________________  Theophilus Kinds MRN: [id number]  Date: 09/07/2016  DOB: September 18, 1946  HDR MammoSite Brachytherapy Procedure Note   DIAGNOSIS: Breast cancer.     ICD-9-CM ICD-10-CM   1. Malignant neoplasm of upper-outer quadrant of right breast in female, estrogen receptor positive ([place]) 174.4 C50.411    V86.0 Z17.0     SITE: Right breast  CURRENT FRACTION: 9  PLANNED FRACTION: 10   SIMULATION VERIFICATION: An image was obtained to verify device position and geometry. The balloon symmetry was verified. The balloon inflation was checked. After careful review, I approved the simulation film.   NARRATIVE: All involved HDR brachytherapy afterloader catheters were connected to the MammoSite device. All catheter numbers and catheter connections were checked by physics and me. The brachytherapy treatment was delivered under my personal supervision uneventfully. Then the transfer tubing was disconnected and the cap placed on the catheter.   DOSIMETRY: Based on the current source strength, 423.7 seconds was calculated to deliver the planned dose of 3.4 Gy.   DISPOSITION: Treatment was delivered uneventfully. Survey of the room after treatment confirmed that the radioactive source returned to the afterloader. The dressing was re-applied with assistance from the nursing staff. The patient tolerated treatment well and will return as scheduled for their next fraction and/or follow up visit.   -----------------------------------  Eppie Gibson, MD   This document serves as a record of services personally performed by Eppie Gibson, MD. It was created on her behalf by Arlyce Harman, a trained medical scribe. The creation of this record is based on the scribe's personal observations and the provider's statements to them. This document has been checked and approved by the attending  provider.

## 2016-09-07 NOTE — Progress Notes (Signed)
   Department of Radiation Oncology  Phone:  [phone] Fax:        [phone] ________________________________  Theophilus Kinds MRN: [id number]  Date: 09/07/2016  DOB: Mar 01, 1946  HDR MammoSite Brachytherapy Procedure Note   DIAGNOSIS:    ICD-9-CM ICD-10-CM   1. Malignant neoplasm of upper-outer quadrant of right breast in female, estrogen receptor positive ([place]) 174.4 C50.411    V86.0 Z17.0     SITE: Right breast  CURRENT FRACTION: 10  PLANNED FRACTION: 10   SIMULATION VERIFICATION: An image was obtained to verify device position and geometry. The balloon symmetry was verified. The balloon inflation was checked. After careful review, I approved the simulation film.   NARRATIVE: All involved HDR brachytherapy afterloader catheters were connected to the MammoSite device. All catheter numbers and catheter connections were checked by physics and me. The brachytherapy treatment was delivered under my personal supervision uneventfully. Then the transfer tubing was disconnected and the cap placed on the catheter.   DOSIMETRY: Based on the current source strength, 423.7 seconds was calculated to deliver the planned dose of 3.4 Gy.   DISPOSITION: Treatment was delivered uneventfully. Survey of the room after treatment confirmed that the radioactive source returned to the afterloader. The dressing was re-applied with assistance from the nursing staff. The patient tolerated treatment well and will return as scheduled for their next fraction and/or follow up visit.   -----------------------------------  Eppie Gibson, MD   This document serves as a record of services personally performed by Eppie Gibson, MD. It was created on her behalf by Arlyce Harman, a trained medical scribe. The creation of this record is based on the scribe's personal observations and the provider's statements to them. This document has been checked and approved by the attending provider.

## 2016-09-10 ENCOUNTER — Telehealth: Payer: Self-pay | Admitting: *Deleted

## 2016-09-10 NOTE — Progress Notes (Signed)
   Department of Radiation Oncology  Phone:  [phone] Fax:        [phone] ________________________________  Theophilus Kinds MRN: [id number]  Date: 09/10/2016  DOB: 11-Oct-1945  HDR MammoSite Brachytherapy Procedure Note   DIAGNOSIS: Breast cancer.   SITE: Right breast  CURRENT FRACTION: 3  PLANNED FRACTION: 10   SIMULATION VERIFICATION: An image was obtained to verify device position and geometry. The balloon symmetry was verified. The balloon inflation was checked. After careful review, I approved the simulation film.   NARRATIVE: All involved HDR brachytherapy afterloader catheters were connected to the MammoSite device. All catheter numbers and catheter connections were checked by physics and me. The brachytherapy treatment was delivered under my personal supervision uneventfully. Then the transfer tubing was disconnected and the cap placed on the catheter.   DOSIMETRY: Based on the current source strength, 412.0 seconds was calculated to deliver the planned dose of 3.4 Gy.   DISPOSITION: Treatment was delivered uneventfully. Survey of the room after treatment confirmed that the radioactive source returned to the afterloader. The dressing was re-applied with assistance from the nursing staff. The patient tolerated treatment well and will return as scheduled for their next fraction and/or follow up visit.  -----------------------------------  Eppie Gibson, MD

## 2016-09-10 NOTE — Progress Notes (Signed)
1.  Malignant neoplasm of upper-outer quadrant of right breast in female, estrogen receptor positive ([place]) 174.4 C50.411   V86.0 Z17.0  Mammosite balloon removal note Outpatient Diagnosis (as above)  The patient was taken to nursing after her final 10th fraction of HDR brachytherapy to her right breast.  After appropriate prepping including lidocaine gel on the patient's skin, I removed 35 cc of water from the balloon with a syringe.  Not being able to yield further water, I noted that 40cc had been injected into the balloon per Dr. Cristal Generous op note.  I then attempted to removed the balloon but encountered resistance.  To ensure that the patient would not encounter trauma from balloon removal, I called Dr. Tammi Klippel into the room for his input.  After appropriate prepping he was able to remove the balloon without incident.  Nursing [facility] RN)  then dressed her lumpectomy site. 1 mo f/u card given, and patient was encouraged to call with any issues if they arise.  -----------------------------------  Eppie Gibson, MD

## 2016-09-10 NOTE — Telephone Encounter (Signed)
  Oncology Nurse Navigator Documentation  Navigator Location: [facility] (09/10/16 1400)   )Navigator Encounter Type: Telephone (09/10/16 1400) Telephone: [place] Call (09/10/16 1400)                   Patient Visit Type: [id number] (09/10/16 1400) Treatment Phase: Final Radiation Tx (mammosite) (09/10/16 1400)                            Time Spent with Patient: 15 (09/10/16 1400)

## 2016-09-13 ENCOUNTER — Encounter: Payer: Self-pay | Admitting: Radiation Oncology

## 2016-09-13 NOTE — Progress Notes (Signed)
  Radiation Oncology         [phone] ________________________________  Name: Mary Ochoa MRN: [id number]  Date: 09/13/2016  DOB: 1946-09-07  End of Treatment Note  Diagnosis: Malignant neoplasm of upper-outer quadrant of right female breast, estrogen receptor positive. Low grade intraductal carcinoma.     Indication for treatment:  Curative       Radiation treatment dates:   09/03/16 - 09/07/16  Site/dose:   Right breast treated to 34 Gy in 10 fractions of 3.4 Gy (BID)  Beams/energy:   HDR-MammoSite  //  Iridium HDR  Narrative: The patient tolerated radiation treatment relatively well. Following her 10th and final fraction, the patient was taken to nursing and the mammosite balloon was deflated and then removed by Dr. Isidore Moos and Dr. Tammi Klippel. The patient was without complaint. She experienced some discomfort within the right breast with the catheter remaining in place for the week.  Plan: The patient has completed radiation treatment. The patient will return to radiation oncology clinic for routine followup in one month. I advised them to call or return sooner if they have any questions or concerns related to their recovery or treatment.  -----------------------------------  Blair Promise, PhD, MD  This document serves as a record of services personally performed by Gery Pray, MD. It was created on his behalf by Maryla Morrow, a trained medical scribe. The creation of this record is based on the scribe's personal observations and the provider's statements to them. This document has been checked and approved by the attending provider.

## 2016-09-25 ENCOUNTER — Telehealth: Payer: Self-pay | Admitting: Oncology

## 2016-09-25 NOTE — Telephone Encounter (Signed)
Left a message for Mary Ochoa to see how she is feeling after radiation.  Requested are return call.

## 2016-09-26 ENCOUNTER — Telehealth: Payer: Self-pay | Admitting: Oncology

## 2016-09-26 NOTE — Telephone Encounter (Signed)
Called [place] and she said she is doing fine.  She does not have any drainage or pain in the mammosite area.  She said she is going to see Dr. Donne Hazel today and will call us tomorrow and let us know the results of the visit.

## 2016-10-10 ENCOUNTER — Ambulatory Visit: Payer: Medicare HMO | Admitting: Radiation Oncology

## 2016-10-10 ENCOUNTER — Ambulatory Visit: Payer: Medicare HMO | Admitting: Oncology

## 2016-10-11 ENCOUNTER — Telehealth: Payer: Self-pay | Admitting: *Deleted

## 2016-10-11 NOTE — Telephone Encounter (Signed)
RETURNED PATIENT'S CALL AND RESCHEDULED MISSED FU, PT. AGREED TO NEW DATE AND TIME.

## 2016-10-12 ENCOUNTER — Ambulatory Visit (HOSPITAL_BASED_OUTPATIENT_CLINIC_OR_DEPARTMENT_OTHER): Payer: Medicare HMO | Admitting: Oncology

## 2016-10-12 VITALS — BP 152/87 | HR 109 | Temp 98.2°F | Resp 18 | Ht 68.0 in | Wt 249.2 lb

## 2016-10-12 DIAGNOSIS — D0511 Intraductal carcinoma in situ of right breast: Secondary | ICD-10-CM | POA: Diagnosis not present

## 2016-10-12 DIAGNOSIS — Z170 Estrogen receptor positive status [ER+]: Secondary | ICD-10-CM

## 2016-10-12 DIAGNOSIS — C50411 Malignant neoplasm of upper-outer quadrant of right female breast: Secondary | ICD-10-CM

## 2016-10-12 NOTE — Progress Notes (Signed)
Mary Ochoa  [phone] Fax:([phone]) [phone]     ID: Mary Ochoa DOB: 10/24/1945  MR#: [id number]  [id number]  Patient Care Team: Midge Minium, MD as PCP - General (Family Medicine) Rolm Bookbinder, MD as Consulting Physician (General Surgery) Chauncey Cruel, MD as Consulting Physician (Oncology) Gery Pray, MD as Consulting Physician (Radiation Oncology) Chauncey Cruel, MD OTHER MD:  CHIEF COMPLAINT: Ductal carcinoma in situ, estrogen receptor positive  CURRENT TREATMENT: Observation   BREAST CANCER HISTORY: From the original intake note:  Mary Ochoa had bilateral screening mammography with tomography at the [facility] 07/05/2016 showing an area of possible asymmetry in the right breast. On 07/10/2016 she underwent diagnostic right mammography with right breast ultrasonography. The breast density was category B. Spot compression views identified an irregular area of asymmetric density in the upper outer quadrant of the right breast measuring 1.0 cm associated with punctate calcifications. This was not palpable and was not seen by ultrasound.  Accordingly the patient underwent an AFFIRM biopsy on 07/12/2016. This showed (SAA [id number]) ductal carcinoma in situ, low-grade, 100% estrogen receptor positive, 100% progesterone receptor positive, all with strong staining intensity.  Her subsequent history is as detailed below  INTERVAL HISTORY: Mary Ochoa returns today for follow-up of her ductal carcinoma in situ accompanied by her daughter Mary Ochoa.since her last visit here the patient underwent right lumpectomy, on 08/27/2016, with the final pathology (Mary Ochoa A [phone]) showing ductal carcinoma in situ, grade 1, measuring 1.1 mm, with negative margins  she then received MammoSite radiotherapy, completed 09/07/2016. She is here today to decide on anti-estrogens.  REVIEW OF SYSTEMS: Shyteria did well with her surgery. She had no unusual pain fever or bleeding  from that procedure. She had more problems with the MammoSite both in terms of insertion and withdrawal. Her breast remains sensitive although not sore. She does occasionally have shooting pains in understands these to be benign and treatment not cancer related. Aside from that a detailed review of systems today was entirely stable  PAST MEDICAL HISTORY: Past Medical History:  Diagnosis Date  . Complication of anesthesia   . Malignant neoplasm of upper-outer quadrant of right female breast ([place]) 07/18/2016  . PONV (postoperative nausea and vomiting)     PAST SURGICAL HISTORY: Past Surgical History:  Procedure Laterality Date  . BREAST LUMPECTOMY WITH RADIOACTIVE SEED LOCALIZATION Right 08/27/2016   Procedure: RIGHT BREAST LUMPECTOMY WITH RADIOACTIVE SEED LOCALIZATION;  Surgeon: Rolm Bookbinder, MD;  Location: [facility];  Service: General;  Laterality: Right;  . BREAST MAMMOSITE Right 08/27/2016   Procedure: PLACEMENT OF MAMMOSITE SPACER;  Surgeon: Rolm Bookbinder, MD;  Location: [facility];  Service: General;  Laterality: Right;  . DILATION AND CURETTAGE OF UTERUS      FAMILY HISTORY Family History  Problem Relation Age of Onset  . Breast cancer Mother   . Alcohol abuse Father   the patient's father died at the age of 55 from cirrhosis of the liver. The patient's mother died at age 38 after having been recently diagnosed with breast cancer. Patient had 2 brothers, one sister. There is no history of ovarian cancer in the family  GYNECOLOGIC HISTORY:  No LMP recorded. Patient is postmenopausal. Menarche age 32, first live birth age 71, she is Mary Ochoa P5. She does not recall exactly when she stopped having periods but she never took hormone replacement or oral contraceptives.  SOCIAL HISTORY:  She is a retired Scientist, forensic. She is widowed and lives by herself, with  no pets. Her children are Mary Ochoa who is a Software engineer and Mary Ochoa, Mary Ochoa who is  a Warehouse manager in [place], Mary Ochoa who is one of our pharmacists hearing Mary Ochoa, Mary Ochoa works in Architect in [place], and Mary Ochoa lives in [place]. The patient has 16 grandchildren. She attends [employer] DIRECTIVES: not in place. The patient states "the children know my wishes". If there is any significant disagreement she would name her daughter Mary Ochoa as healthcare part of attorney. Her number is [phone]   HEALTH MAINTENANCE: Social History  Substance Use Topics  . Smoking status: Never Smoker  . Smokeless tobacco: Never Used  . Alcohol use No     Colonoscopy:  PAP:  Bone density:   Allergies  Allergen Reactions  . Tape Rash    medipore tape.      Current Outpatient Prescriptions  Medication Sig Dispense Refill  . HYDROcodone-acetaminophen (NORCO) 5-325 MG tablet Take 1-2 tablets by mouth every 6 (six) hours as needed. 15 tablet 0   No current facility-administered medications for this visit.     OBJECTIVE: middle-aged white woman Who appears well Vitals:   10/12/16 1122  BP: (!) 152/87  Pulse: (!) 109  Resp: 18  Temp: 98.2 F (36.8 C)     Body mass index is 37.89 kg/m.    ECOG FS:0 - Asymptomatic  Sclerae unicteric, pupils round and equal Oropharynx clear and moist-- no thrush or other lesions No cervical or supraclavicular adenopathy Lungs no rales or rhonchi Heart regular rate and rhythm Abd soft, nontender, positive bowel sounds MSK no focal spinal tenderness, no upper extremity lymphedema Neuro: nonfocal, well oriented, appropriate affect Breasts: The right breast is status post lumpectomy and MammoSite radiation. The cosmetic result is very good. There is minimal induration under the incision line. There is no erythema or swelling. The right axilla is benign. The left breast is unremarkable.  LAB RESULTS:  CMP     Component Value Date/Time   NA 142 08/01/2016 1206   K 4.2 08/01/2016 1206   CL  104 05/03/2016 1416   CO2 26 08/01/2016 1206   GLUCOSE 100 08/01/2016 1206   BUN 12.4 08/01/2016 1206   CREATININE 0.8 08/01/2016 1206   CALCIUM 10.2 08/01/2016 1206   PROT 7.8 08/01/2016 1206   ALBUMIN 3.7 08/01/2016 1206   AST 26 08/01/2016 1206   ALT 22 08/01/2016 1206   ALKPHOS 84 08/01/2016 1206   BILITOT 0.55 08/01/2016 1206   GFRNONAA >60 04/13/2015 1518   GFRAA >60 04/13/2015 1518    INo results found for: SPEP, UPEP  Lab Results  Component Value Date   WBC 9.3 08/01/2016   NEUTROABS 6.2 08/01/2016   HGB 15.0 08/01/2016   HCT 45.3 08/01/2016   MCV 92.0 08/01/2016   PLT 330 08/01/2016      Chemistry      Component Value Date/Time   NA 142 08/01/2016 1206   K 4.2 08/01/2016 1206   CL 104 05/03/2016 1416   CO2 26 08/01/2016 1206   BUN 12.4 08/01/2016 1206   CREATININE 0.8 08/01/2016 1206      Component Value Date/Time   CALCIUM 10.2 08/01/2016 1206   ALKPHOS 84 08/01/2016 1206   AST 26 08/01/2016 1206   ALT 22 08/01/2016 1206   BILITOT 0.55 08/01/2016 1206       No results found for: LABCA2  No components found for: LABCA125  No results for input(s): INR in the  last 168 hours.  Urinalysis No results found for: COLORURINE, APPEARANCEUR, LABSPEC, PHURINE, GLUCOSEU, HGBUR, BILIRUBINUR, KETONESUR, PROTEINUR, UROBILINOGEN, NITRITE, LEUKOCYTESUR   STUDIES: No results found.  ELIGIBLE FOR AVAILABLE RESEARCH PROTOCOL: Patient opted against COMET trial   ASSESSMENT: 71 y.o. [place] woman status post right breast upper outer quadrant biopsy 07/12/2016 for ductal carcinoma in situ, low-grade, estrogen and progesterone receptor strongly positive   (1) discussed the Comet trial, but patient declined to participate  (2) left lumpectomy 08/27/2016 showed only ductal carcinoma in situ, grade 1, with negative margins   (3) adjuvant mammosite radiotherapy 09/03/16 - 09/07/16: Site/dose:   Right breast treated to 34 Gy in 10 fractions of 3.4 Gy (BID)  (4)  opted against anti-estrogens at the completion of local treatment    PLAN:  I spent a little over 30 minutes with the patient and her daughter going over the possible benefits as well as the possible side effects and toxicities of anti-estrogens. We specifically discussed the differences between tamoxifen and anastrozole.   I quoted Cammy a risk of local recurrence in the 10% or less range. Her risk of developing another breast cancer in either breast approximate 1% per year so if she plans to live another 20 years and would approximate both these risks would be cut in half by taking anti-estrogens for 5 years.  After a long discussion a preventive strategy was not motivating to  [place] and she is comfortable with observation alone.. She already has appointments with Dr. Donne Hazel the year from now and with her primary care physician sometime in the summer, so she will return to see me in October after her next mammogram  She knows to call for any problems that may develop before that visit.  Chauncey Cruel, MD   10/12/2016 11:42 AM Medical Oncology and Hematology [facility] [address] [place], [place] [address] Tel. [phone]    Fax. [phone]

## 2016-10-30 ENCOUNTER — Ambulatory Visit: Payer: Medicare HMO | Admitting: Radiation Oncology

## 2016-10-30 ENCOUNTER — Encounter: Payer: Self-pay | Admitting: Oncology

## 2016-10-30 DIAGNOSIS — R69 Illness, unspecified: Secondary | ICD-10-CM | POA: Diagnosis not present

## 2016-11-05 ENCOUNTER — Ambulatory Visit
Admission: RE | Admit: 2016-11-05 | Discharge: 2016-11-05 | Disposition: A | Discharge status: Home / Self Care | Payer: Medicare HMO | Source: Ambulatory Visit | Attending: Radiation Oncology | Admitting: Radiation Oncology

## 2016-11-05 ENCOUNTER — Encounter: Payer: Self-pay | Admitting: Radiation Oncology

## 2016-11-05 DIAGNOSIS — Y842 Radiological procedure and radiotherapy as the cause of abnormal reaction of the patient, or of later complication, without mention of misadventure at the time of the procedure: Secondary | ICD-10-CM | POA: Diagnosis not present

## 2016-11-05 DIAGNOSIS — Z170 Estrogen receptor positive status [ER+]: Secondary | ICD-10-CM | POA: Diagnosis not present

## 2016-11-05 DIAGNOSIS — N644 Mastodynia: Secondary | ICD-10-CM | POA: Insufficient documentation

## 2016-11-05 DIAGNOSIS — C50411 Malignant neoplasm of upper-outer quadrant of right female breast: Secondary | ICD-10-CM

## 2016-11-05 NOTE — Progress Notes (Signed)
Mary Ochoa is here for follow up.  She denies having pain other than sharp pains in her right breast.  She denies having fatigue.  The skin on her right breast is intact.  The mammosite insertion scar is healed and red in color.  BP (!) 142/87 (BP Location: Left Arm, Patient Position: Sitting)   Pulse 74   Temp 98.3 F (36.8 C) (Oral)   Ht 5\' 8"  (1.727 m)   Wt 249 lb 12.8 oz (113.3 kg)   SpO2 98%   BMI 37.98 kg/m    Wt Readings from Last 3 Encounters:  11/05/16 249 lb 12.8 oz (113.3 kg)  10/12/16 249 lb 3.2 oz (113 kg)  08/27/16 247 lb (112 kg)

## 2016-11-05 NOTE — Progress Notes (Signed)
  Radiation Oncology         [phone] ________________________________   Name: Mary Ochoa MRN: [id number]  Date: 11/05/2016  DOB: 1946-01-29  Follow-Up Visit Note  CC: Annye Asa, MD  Rolm Bookbinder, MD    ICD-9-CM ICD-10-CM   1. Malignant neoplasm of upper-outer quadrant of right breast in female, estrogen receptor positive ([place]) 174.4 C50.411    V86.0 Z17.0     Diagnosis:  Low grade intraductal carcinoma, ER Positive  Interval Since Last Radiation:  2 months 09/03/16 - 09/07/16 : Right Breast treated to 34 Gy in 10 fractions Using the MammoSite treatment device with iridium 192  Narrative:  The patient returns today for routine follow-up of radiation completed 09/07/16.  On review of systems, the patient denies having pain other than some sharp pains in her right breast. She denies fatigue. She has met with Dr. Donne Hazel and reports  No concerns with her surgical recovery; she is scheduled to follow up with him in a year. She has consulted with Dr. Jana Hakim and elected not to proceed with systemic hormonal treatment.                             ALLERGIES:  is allergic to tape.  Meds: Current Outpatient Prescriptions  Medication Sig Dispense Refill  . HYDROcodone-acetaminophen (NORCO) 5-325 MG tablet Take 1-2 tablets by mouth every 6 (six) hours as needed. (Patient not taking: Reported on 11/05/2016) 15 tablet 0   No current facility-administered medications for this encounter.     Physical Findings: The patient is in no acute distress. Patient is alert and oriented.  height is _0  (1.727 m) and weight is 249 lb 12.8 oz (113.3 kg). Her oral temperature is 98.3 F (36.8 C). Her blood pressure is 142/87 (abnormal) and her pulse is 74. Her oxygen saturation is 98%.  No significant changes. Heart is regular in rate and rhythm. Lungs are clear to auscultation bilaterally.  Breast exam reveals a well healed scar in the upper outer quadrant of the right breast with  some mild induration under the scar consistent with possible small seroma. The breast in non tender with palpation. No mass appreciated in the breast. No nipple discharge or bleeding.  Lab Findings: Lab Results  Component Value Date   WBC 9.3 08/01/2016   HGB 15.0 08/01/2016   HCT 45.3 08/01/2016   MCV 92.0 08/01/2016   PLT 330 08/01/2016    Radiographic Findings: No results found.  Impression:  The patient is recovering from the effects of radiation.  No evidence of recurrence on clinical exam. The patient is doing well since her partial breast radiation therapy.  Plan:  The patient will follow up in 6 months. The patient will follow up with Dr. Jana Hakim in October. I encouraged her to continue with routine yearly mammograms. She will follow up with Dr. Donne Hazel on a yearly basis.  -----------------------------------  Blair Promise, PhD, MD  This document serves as a record of services personally performed by Gery Pray, MD. It was created on his behalf by Maryla Morrow, a trained medical scribe. The creation of this record is based on the scribe's personal observations and the provider's statements to them. This document has been checked and approved by the attending provider.

## 2016-12-15 DIAGNOSIS — J029 Acute pharyngitis, unspecified: Secondary | ICD-10-CM | POA: Diagnosis not present

## 2016-12-25 DIAGNOSIS — H5213 Myopia, bilateral: Secondary | ICD-10-CM | POA: Diagnosis not present

## 2017-01-01 ENCOUNTER — Telehealth: Payer: Self-pay | Admitting: Oncology

## 2017-01-01 NOTE — Telephone Encounter (Signed)
lvm to inform pt of SCP appt in April per LOS

## 2017-01-17 ENCOUNTER — Telehealth: Payer: Self-pay | Admitting: Oncology

## 2017-01-17 NOTE — Telephone Encounter (Signed)
Pt called to cxl SCp appt 4/30. Pt did not want to r/s

## 2017-01-21 ENCOUNTER — Encounter: Payer: Medicare HMO | Admitting: Adult Health

## 2017-03-12 ENCOUNTER — Telehealth: Payer: Self-pay

## 2017-03-12 NOTE — Telephone Encounter (Signed)
Patient is on the list for [employer] 2018 and may be a good candidate for an AWV. Please let me know if/when appt is scheduled.   

## 2017-05-09 ENCOUNTER — Ambulatory Visit
Admission: RE | Admit: 2017-05-09 | Discharge: 2017-05-09 | Disposition: A | Discharge status: Home / Self Care | Payer: Medicare HMO | Source: Ambulatory Visit | Attending: Radiation Oncology | Admitting: Radiation Oncology

## 2017-05-09 NOTE — Progress Notes (Signed)
Patient No Show for Dr. Sondra Come 6 month f/u  LM on home # & cell # for patient to call and reschedule.

## 2017-07-15 DIAGNOSIS — R69 Illness, unspecified: Secondary | ICD-10-CM | POA: Diagnosis not present

## 2017-07-18 NOTE — Progress Notes (Signed)
[place]  [phone] [phone]     ID: Mary Ochoa DOB: [birth year]  MR#: [id number]  TKZ#:[id number]  Patient Care Team: Midge Minium, MD as PCP - General (Family Medicine) Rolm Bookbinder, MD as Consulting Physician (General Surgery) Magrinat, Virgie Dad, MD as Consulting Physician (Oncology) Gery Pray, MD as Consulting Physician (Radiation Oncology) Delice Bison, Charlestine Massed, NP as Nurse Practitioner (Hematology and Oncology) OTHER MD:  CHIEF COMPLAINT: Ductal carcinoma in situ, estrogen receptor positive  CURRENT TREATMENT: Observation   BREAST CANCER HISTORY: From the original intake note:  Mary Ochoa had bilateral screening mammography with tomography at the [facility] 07/05/2016 showing an area of possible asymmetry in the right breast. On 07/10/2016 she underwent diagnostic right mammography with right breast ultrasonography. The breast density was category B. Spot compression views identified an irregular area of asymmetric density in the upper outer quadrant of the right breast measuring 1.0 cm associated with punctate calcifications. This was not palpable and was not seen by ultrasound.  Accordingly the patient underwent an AFFIRM biopsy on 07/12/2016. This showed ([id number]) ductal carcinoma in situ, low-grade, 100% estrogen receptor positive, 100% progesterone receptor positive, all with strong staining intensity.  Her subsequent history is as detailed below  INTERVAL HISTORY: Mary Ochoa returns today for follow-up of her noninvasive breast cancer.  She continues on observation alone.  Asked what her worst problem is she has really no worse problems.  She is doing a great deal of traveling to visit grandchildren.  REVIEW OF SYSTEMS: Mary Ochoa reports doing very minimal exercises. She does not belong to a gym and doesn't go for walks because of her bilateral hip pain. Her family is doing well. She denies unusual headaches,  visual changes, nausea, vomiting, or dizziness. There has been no unusual cough, phlegm production, or pleurisy. This been no change in bowel or bladder habits. She denies unexplained fatigue or unexplained weight loss, bleeding, rash, or fever. A detailed review of systems was otherwise entirely stable.   PAST MEDICAL HISTORY: Past Medical History:  Diagnosis Date  . Complication of anesthesia   . History of radiation therapy 09/03/16-09/07/16   Right breast 34 gray in 10 fractions - Mammosite.  . Malignant neoplasm of upper-outer quadrant of right female breast ([place]) 07/18/2016  . PONV (postoperative nausea and vomiting)     PAST SURGICAL HISTORY: Past Surgical History:  Procedure Laterality Date  . BREAST LUMPECTOMY WITH RADIOACTIVE SEED LOCALIZATION Right 08/27/2016   Procedure: RIGHT BREAST LUMPECTOMY WITH RADIOACTIVE SEED LOCALIZATION;  Surgeon: Rolm Bookbinder, MD;  Location: [place];  Service: General;  Laterality: Right;  . BREAST MAMMOSITE Right 08/27/2016   Procedure: PLACEMENT OF MAMMOSITE SPACER;  Surgeon: Rolm Bookbinder, MD;  Location: [place];  Service: General;  Laterality: Right;  . DILATION AND CURETTAGE OF UTERUS      FAMILY HISTORY Family History  Problem Relation Age of Onset  . Breast cancer Mother   . Alcohol abuse Father   the patient's father died at the age of 31 from cirrhosis of the liver. The patient's mother died at age 33 after having been recently diagnosed with breast cancer. Patient had 2 brothers, one sister. There is no history of ovarian cancer in the family  GYNECOLOGIC HISTORY:  No LMP recorded. Patient is postmenopausal. Menarche age 50, first live birth age 68, she is [place] P5. She does not recall exactly when she stopped having periods but she never took hormone replacement or oral contraceptives.  SOCIAL  HISTORY:  She is a retired Scientist, forensic. She is widowed and lives by herself, with no pets. Her  children are Mary Ochoa who is a Software engineer and Mary Ochoa, Mary Ochoa who is a Warehouse manager in [place], [place] who is one of our pharmacists hearing Mary Ochoa, Mary Ochoa works in Architect in [place], and Mary Ochoa lives in [place]. The patient has 16 grandchildren. She attends [employer] DIRECTIVES: not in place. The patient states "the children know my wishes". If there is any significant disagreement she would name her daughter Mary Ochoa as healthcare part of attorney. Her number is [phone]   HEALTH MAINTENANCE: Social History  Substance Use Topics  . Smoking status: Never Smoker  . Smokeless tobacco: Never Used  . Alcohol use No     Colonoscopy:  PAP:  Bone density:   Allergies  Allergen Reactions  . Tape Rash    medipore tape.      Current Outpatient Prescriptions  Medication Sig Dispense Refill  . HYDROcodone-acetaminophen (NORCO) 5-325 MG tablet Take 1-2 tablets by mouth every 6 (six) hours as needed. (Patient not taking: Reported on 11/05/2016) 15 tablet 0   No current facility-administered medications for this visit.     OBJECTIVE: middle-aged white woman in no acute distress Vitals:   07/23/17 1245  BP: (!) 144/81  Pulse: 83  Resp: 17  Temp: 98.1 F (36.7 C)  SpO2: 97%     Body mass index is 39.08 kg/m.    ECOG FS:0 - Asymptomatic  Sclerae unicteric, EOMs intact Oropharynx clear and moist No cervical or supraclavicular adenopathy Lungs no rales or rhonchi Heart regular rate and rhythm Abd soft, nontender, positive bowel sounds MSK no focal spinal tenderness, no upper extremity lymphedema Neuro: nonfocal, well oriented, appropriate affect Breasts: Status post MammoSite radiation and lumpectomy on the right.  There is no evidence of local recurrence.  Left breast is unremarkable.  Both axilla are benign.  LAB RESULTS:  CMP     Component Value Date/Time   NA 142 08/01/2016 1206   K 4.2 08/01/2016 1206     CL 104 05/03/2016 1416   CO2 26 08/01/2016 1206   GLUCOSE 100 08/01/2016 1206   BUN 12.4 08/01/2016 1206   CREATININE 0.8 08/01/2016 1206   CALCIUM 10.2 08/01/2016 1206   PROT 7.8 08/01/2016 1206   ALBUMIN 3.7 08/01/2016 1206   AST 26 08/01/2016 1206   ALT 22 08/01/2016 1206   ALKPHOS 84 08/01/2016 1206   BILITOT 0.55 08/01/2016 1206   GFRNONAA >60 04/13/2015 1518   GFRAA >60 04/13/2015 1518    INo results found for: SPEP, UPEP  Lab Results  Component Value Date   WBC 9.3 08/01/2016   NEUTROABS 6.2 08/01/2016   HGB 15.0 08/01/2016   HCT 45.3 08/01/2016   MCV 92.0 08/01/2016   PLT 330 08/01/2016      Chemistry      Component Value Date/Time   NA 142 08/01/2016 1206   K 4.2 08/01/2016 1206   CL 104 05/03/2016 1416   CO2 26 08/01/2016 1206   BUN 12.4 08/01/2016 1206   CREATININE 0.8 08/01/2016 1206      Component Value Date/Time   CALCIUM 10.2 08/01/2016 1206   ALKPHOS 84 08/01/2016 1206   AST 26 08/01/2016 1206   ALT 22 08/01/2016 1206   BILITOT 0.55 08/01/2016 1206       No results found for: LABCA2  No components found for: [id number]  No  results for input(s): INR in the last 168 hours.  Urinalysis No results found for: COLORURINE, APPEARANCEUR, LABSPEC, PHURINE, GLUCOSEU, HGBUR, BILIRUBINUR, KETONESUR, PROTEINUR, UROBILINOGEN, NITRITE, LEUKOCYTESUR   STUDIES: No results found.  ELIGIBLE FOR AVAILABLE RESEARCH PROTOCOL: Patient opted against COMET trial   ASSESSMENT: 71 y.o. [place] woman status post right breast upper outer quadrant biopsy 07/12/2016 for ductal carcinoma in situ, low-grade, estrogen and progesterone receptor strongly positive   (1) discussed the Comet trial, but patient declined to participate  (2) left lumpectomy 08/27/2016 showed only ductal carcinoma in situ, grade 1, with negative margins   (3) adjuvant mammosite radiotherapy 09/03/16 - 09/07/16: Site/dose:   Right breast treated to 34 Gy in 10 fractions of 3.4 Gy  (BID)  (4) opted against anti-estrogens at the completion of local treatment    PLAN: She is just about a year out from definitive surgery with no evidence of disease recurrence.  This is favorable.  She thought that radiation, which she received for her breast cancer, could cause cancer in other parts of the body like the liver or lungs.  I reassured her that that is not the case.  She has decided not to have mammography in October because it runs the holidays that she gets a bad report.  She tells me she will schedule her mammogram in January which is the time her daughter has hers.  She did not want me to put the order in: She will put it in herself when she knows the date of her daughter's visit  We discussed [facility]  I recommended 45 minutes 5 times a week and starting really slowly  I will see her again in February 2020.  She knows to call for any issues that may develop before that visit.  Magrinat, Virgie Dad, MD  07/23/17 12:57 PM Medical Oncology and Hematology [facility] [address] [place], [place] [address] Tel. [phone]    Fax. [phone]  This document serves as a record of services personally performed by Chauncey Cruel, MD. It was created on his behalf by Margit Banda, a trained medical scribe. The creation of this record is based on the scribe's personal observations and the provider's statements to them. This document has been checked and approved by the attending provider.

## 2017-07-23 ENCOUNTER — Ambulatory Visit (HOSPITAL_BASED_OUTPATIENT_CLINIC_OR_DEPARTMENT_OTHER): Payer: Medicare HMO | Admitting: Oncology

## 2017-07-23 VITALS — BP 144/81 | HR 83 | Temp 98.1°F | Resp 17 | Ht 68.0 in | Wt 257.0 lb

## 2017-07-23 DIAGNOSIS — C50411 Malignant neoplasm of upper-outer quadrant of right female breast: Secondary | ICD-10-CM

## 2017-07-23 DIAGNOSIS — Z86000 Personal history of in-situ neoplasm of breast: Secondary | ICD-10-CM

## 2017-07-23 DIAGNOSIS — Z170 Estrogen receptor positive status [ER+]: Secondary | ICD-10-CM | POA: Diagnosis not present

## 2017-07-23 DIAGNOSIS — D0511 Intraductal carcinoma in situ of right breast: Secondary | ICD-10-CM

## 2017-10-02 ENCOUNTER — Other Ambulatory Visit: Payer: Self-pay | Admitting: Family Medicine

## 2017-10-02 DIAGNOSIS — Z9889 Other specified postprocedural states: Secondary | ICD-10-CM

## 2017-10-22 ENCOUNTER — Other Ambulatory Visit: Payer: Self-pay | Admitting: Family Medicine

## 2017-10-22 ENCOUNTER — Ambulatory Visit
Admission: RE | Admit: 2017-10-22 | Discharge: 2017-10-22 | Disposition: A | Discharge status: Home / Self Care | Payer: Medicare HMO | Source: Ambulatory Visit | Attending: Family Medicine | Admitting: Family Medicine

## 2017-10-22 DIAGNOSIS — Z9889 Other specified postprocedural states: Secondary | ICD-10-CM

## 2017-10-22 DIAGNOSIS — R921 Mammographic calcification found on diagnostic imaging of breast: Secondary | ICD-10-CM

## 2017-10-22 HISTORY — DX: Personal history of irradiation: Z92.3

## 2017-12-10 NOTE — Progress Notes (Addendum)
Subjective:   Mary Ochoa is a 72 y.o. female who presents for Medicare Annual (Subsequent) preventive examination.  Review of Systems:  No ROS.  Medicare Wellness Visit. Additional risk factors are reflected in the social history.  Cardiac Risk Factors include: advanced age (>69men, >70 women);obesity (BMI >30kg/m2)   Sleep patterns: Sleeps 8-9 hours, interrupted often.  Home Safety/Smoke Alarms: Feels safe in home. Smoke alarms in place.  Living environment; residence and Firearm Safety: Lives alone in 2 story home.  Seat Belt Safety/Bike Helmet: Wears seat belt.   Female:   Pap-N/A      Mammo-10/22/2017, BI-RADS CATEGORY  3: Probably benign. Scheduled for 04/22/18.      Dexa scan-Declines.         CCS-Cologuard ordered today.      Objective:     Vitals: BP (!) 162/90 (BP Location: Left Arm, Cuff Size: Normal)   Pulse 76   Temp 98 F (36.7 C) (Temporal)   Resp 18   Ht 5\' 8"  (1.727 m)   Wt 261 lb 3.2 oz (118.5 kg)   SpO2 97%   BMI 39.72 kg/m   Body mass index is 39.72 kg/m.  Advanced Directives 12/11/2017 11/05/2016 08/27/2016 08/21/2016 08/02/2016 08/01/2016 04/13/2015  Does Patient Have a Medical Advance Directive? No No No No No No Yes  Type of Advance Directive - - - - - - [place]  Would patient like information on creating a medical advance directive? No - Patient declined - No - Patient declined - - No - patient declined information -    Tobacco Social History   Tobacco Use  Smoking Status Never Smoker  Smokeless Tobacco Never Used     Counseling given: Not Answered    Past Medical History:  Diagnosis Date  . Complication of anesthesia   . History of radiation therapy 09/03/16-09/07/16   Right breast 34 gray in 10 fractions - Mammosite.  . Malignant neoplasm of upper-outer quadrant of right female breast ([place]) 07/18/2016  . Personal history of radiation therapy   . PONV (postoperative nausea and vomiting)    Past Surgical  History:  Procedure Laterality Date  . BREAST LUMPECTOMY Right 08/27/2016  . BREAST LUMPECTOMY WITH RADIOACTIVE SEED LOCALIZATION Right 08/27/2016   Procedure: RIGHT BREAST LUMPECTOMY WITH RADIOACTIVE SEED LOCALIZATION;  Surgeon: Rolm Bookbinder, MD;  Location: [place];  Service: General;  Laterality: Right;  . BREAST MAMMOSITE Right 08/27/2016   Procedure: PLACEMENT OF MAMMOSITE SPACER;  Surgeon: Rolm Bookbinder, MD;  Location: [place];  Service: General;  Laterality: Right;  . DILATION AND CURETTAGE OF UTERUS     Family History  Problem Relation Age of Onset  . Breast cancer Mother   . Alcohol abuse Father    Social History   Socioeconomic History  . Marital status: Widowed    Spouse name: None  . Number of children: None  . Years of education: None  . Highest education level: None  Social Needs  . Financial resource strain: None  . Food insecurity - worry: None  . Food insecurity - inability: None  . Transportation needs - medical: None  . Transportation needs - non-medical: None  Occupational History  . None  Tobacco Use  . Smoking status: Never Smoker  . Smokeless tobacco: Never Used  Substance and Sexual Activity  . Alcohol use: No  . Drug use: No  . Sexual activity: None  Other Topics Concern  . None  Social History Narrative  .  None    Outpatient Encounter Medications as of 12/11/2017  Medication Sig  . HYDROcodone-acetaminophen (NORCO) 5-325 MG tablet Take 1-2 tablets by mouth every 6 (six) hours as needed. (Patient not taking: Reported on 11/05/2016)   No facility-administered encounter medications on file as of 12/11/2017.     Activities of Daily Living In your present state of health, do you have any difficulty performing the following activities: 12/11/2017  Hearing? N  Vision? N  Difficulty concentrating or making decisions? N  Walking or climbing stairs? N  Dressing or bathing? N  Doing errands, shopping? N    Preparing Food and eating ? N  Using the Toilet? N  In the past six months, have you accidently leaked urine? N  Do you have problems with loss of bowel control? N  Managing your Medications? N  Managing your Finances? N  Housekeeping or managing your Housekeeping? N  Some recent data might be hidden    Patient Care Team: Midge Minium, MD as PCP - General (Family Medicine) Rolm Bookbinder, MD as Consulting Physician (General Surgery) Magrinat, Virgie Dad, MD as Consulting Physician (Oncology) Gery Pray, MD as Consulting Physician (Radiation Oncology) Delice Bison Charlestine Massed, NP as Nurse Practitioner (Hematology and Oncology)    Assessment:   This is a routine wellness examination for [name].  Exercise Activities and Dietary recommendations Current Exercise Habits: Home exercise routine, Type of exercise: Other - see comments(gizelle), Exercise limited by: None identified   Diet (meal preparation, eat out, water intake, caffeinated beverages, dairy products, fruits and vegetables): Drinks OJ, water, milk and kool-aid.   Breakfast: cereal, OJ Lunch: rice, vegetables Dinner: protein and vegetables.    Goals    . Increase physical activity     Increase activity by walking more.        Fall Risk Fall Risk  12/11/2017 11/05/2016 05/03/2016  Falls in the past year? No No No    Depression Screen PHQ 2/9 Scores 12/11/2017 11/05/2016 05/03/2016  PHQ - 2 Score 0 0 0     Cognitive Function MMSE - Mini Mental State Exam 12/11/2017  Orientation to time 5  Orientation to Place 5  Registration 3  Attention/ Calculation 5  Recall 3  Language- name 2 objects 2  Language- repeat 1  Language- follow 3 step command 3  Language- read & follow direction 1  Write a sentence 1  Copy design 1  Total score 30        Immunization History  Administered Date(s) Administered  . Influenza-Unspecified 08/19/2017     Screening Tests Health Maintenance  Topic Date Due  .  Fecal DNA (Cologuard)  02/06/1998  . DEXA SCAN  12/12/2018 (Originally 02/06/2013)  . TETANUS/TDAP  12/12/2018 (Originally 02/07/1967)  . Hepatitis C Screening  12/12/2018 (Originally [birth year])  . PNA vac Low Risk Adult (1 of 2 - PCV13) 12/12/2018 (Originally 02/06/2013)  . MAMMOGRAM  10/23/2019  . INFLUENZA VACCINE  Completed        Plan:    Continue doing brain stimulating activities (puzzles, reading, adult coloring books, staying active) to keep memory sharp.   I have personally reviewed and noted the following in the patient's chart:   . Medical and social history . Use of alcohol, tobacco or illicit drugs  . Current medications and supplements . Functional ability and status . Nutritional status . Physical activity . Advanced directives . List of other physicians . Hospitalizations, surgeries, and ER visits in previous 12 months . Vitals . Screenings to  include cognitive, depression, and falls . Referrals and appointments  In addition, I have reviewed and discussed with patient certain preventive protocols, quality metrics, and best practice recommendations. A written personalized care plan for preventive services as well as general preventive health recommendations were provided to patient.     Gerilyn Nestle, RN  12/11/2017  Reviewed documentation provided by RN and agree w/ above.  Annye Asa, MD

## 2017-12-11 ENCOUNTER — Other Ambulatory Visit: Payer: Self-pay

## 2017-12-11 ENCOUNTER — Ambulatory Visit (INDEPENDENT_AMBULATORY_CARE_PROVIDER_SITE_OTHER): Payer: Medicare HMO

## 2017-12-11 ENCOUNTER — Ambulatory Visit (INDEPENDENT_AMBULATORY_CARE_PROVIDER_SITE_OTHER): Payer: Medicare HMO | Admitting: Family Medicine

## 2017-12-11 ENCOUNTER — Encounter: Payer: Self-pay | Admitting: Family Medicine

## 2017-12-11 VITALS — BP 130/84 | HR 76 | Temp 98.0°F | Resp 18 | Ht 68.0 in | Wt 261.1 lb

## 2017-12-11 VITALS — BP 162/90 | HR 76 | Temp 98.0°F | Resp 18 | Ht 68.0 in | Wt 261.2 lb

## 2017-12-11 DIAGNOSIS — R030 Elevated blood-pressure reading, without diagnosis of hypertension: Secondary | ICD-10-CM | POA: Diagnosis not present

## 2017-12-11 DIAGNOSIS — Z1211 Encounter for screening for malignant neoplasm of colon: Secondary | ICD-10-CM | POA: Diagnosis not present

## 2017-12-11 DIAGNOSIS — Z0000 Encounter for general adult medical examination without abnormal findings: Secondary | ICD-10-CM | POA: Diagnosis not present

## 2017-12-11 DIAGNOSIS — E669 Obesity, unspecified: Secondary | ICD-10-CM | POA: Diagnosis not present

## 2017-12-11 DIAGNOSIS — Z853 Personal history of malignant neoplasm of breast: Secondary | ICD-10-CM | POA: Diagnosis not present

## 2017-12-11 LAB — CBC WITH DIFFERENTIAL/PLATELET
BASOS ABS: 0.1 10*3/uL (ref 0.0–0.1)
Basophils Relative: 1 % (ref 0.0–3.0)
EOS ABS: 0.1 10*3/uL (ref 0.0–0.7)
Eosinophils Relative: 1.5 % (ref 0.0–5.0)
HCT: 44.1 % (ref 36.0–46.0)
Hemoglobin: 14.7 g/dL (ref 12.0–15.0)
LYMPHS ABS: 1.4 10*3/uL (ref 0.7–4.0)
Lymphocytes Relative: 19.9 % (ref 12.0–46.0)
MCHC: 33.5 g/dL (ref 30.0–36.0)
MCV: 93.2 fl (ref 78.0–100.0)
MONO ABS: 0.5 10*3/uL (ref 0.1–1.0)
Monocytes Relative: 6.9 % (ref 3.0–12.0)
NEUTROS ABS: 5 10*3/uL (ref 1.4–7.7)
NEUTROS PCT: 70.7 % (ref 43.0–77.0)
PLATELETS: 359 10*3/uL (ref 150.0–400.0)
RBC: 4.73 Mil/uL (ref 3.87–5.11)
RDW: 13.5 % (ref 11.5–15.5)
WBC: 7 10*3/uL (ref 4.0–10.5)

## 2017-12-11 LAB — HEMOGLOBIN A1C: HEMOGLOBIN A1C: 6.9 % — AB (ref 4.6–6.5)

## 2017-12-11 LAB — TSH: TSH: 1.95 u[IU]/mL (ref 0.35–4.50)

## 2017-12-11 NOTE — Assessment & Plan Note (Signed)
New.  Pt's BP was mildly elevated in October and at today's [place], BP was quite elevated.  This came down nicely by end of visit but something that will need to be monitored going forward.  Stressed need for healthy diet and regular exercise.  Will follow.

## 2017-12-11 NOTE — Patient Instructions (Signed)
Schedule your complete physical in 6 months (we will recheck your BP at this time) We'll notify you of your lab results and make any changes if needed Continue to work on healthy diet and regular exercise- you can do it! Call with any questions or concerns [place]!!!

## 2017-12-11 NOTE — Progress Notes (Signed)
   Subjective:    Patient ID: Mary Ochoa, female    DOB: 01/14/1946, 72 y.o.   MRN: [id number]  HPI Obesity- ongoing issue.  Pt has gained 4 lbs since October.  Pt is interested in having a thyroid test due to weight gain and fatigue.  No regular exercise.  No particular diet.  Elevated BP- BP today is 162/90 and was 144/81 in October.  No CP, SOB, HAs, visual changes, edema  DCIS- since last visit, pt was dx'd w/ DCIS in R breast.  Underwent lumpectomy and radiation.  Treatment has since finished.  Currently cancer free.  UTD on mammo.   Review of Systems For ROS see HPI     Objective:   Physical Exam  Constitutional: She is oriented to person, place, and time. She appears well-developed and well-nourished. No distress.  obese  HENT:  Head: Normocephalic and atraumatic.  Eyes: Conjunctivae and EOM are normal. Pupils are equal, round, and reactive to light.  Neck: Normal range of motion. Neck supple. No thyromegaly present.  Cardiovascular: Normal rate, regular rhythm, normal heart sounds and intact distal pulses.  No murmur heard. Pulmonary/Chest: Effort normal and breath sounds normal. No respiratory distress.  Abdominal: Soft. She exhibits no distension. There is no tenderness.  Musculoskeletal: She exhibits no edema.  Lymphadenopathy:    She has no cervical adenopathy.  Neurological: She is alert and oriented to person, place, and time.  Skin: Skin is warm and dry.  Psychiatric: She has a normal mood and affect. Her behavior is normal.  Vitals reviewed.         Assessment & Plan:

## 2017-12-11 NOTE — Assessment & Plan Note (Signed)
Following w/ Dr Jana Hakim for routine checkups.  No longer in 'active treatment' but in surveillance mode.  Will continue to follow along.

## 2017-12-11 NOTE — Assessment & Plan Note (Signed)
Deteriorated.  Pt has gained 4 lbs since October visit w/ Dr Jana Hakim.  Pt reports no regular exercise and not following a particular diet.  Stressed need for both.  Check labs to risk stratify.  Will follow.

## 2017-12-11 NOTE — Patient Instructions (Addendum)
Continue doing brain stimulating activities (puzzles, reading, adult coloring books, staying active) to keep memory sharp.    Health Maintenance, Female Adopting a healthy lifestyle and getting preventive care can go a long way to promote health and wellness. Talk with your health care provider about what schedule of regular examinations is right for you. This is a good chance for you to check in with your provider about disease prevention and staying healthy. In between checkups, there are plenty of things you can do on your own. Experts have done a lot of research about which lifestyle changes and preventive measures are most likely to keep you healthy. Ask your health care provider for more information. Weight and diet Eat a healthy diet  Be sure to include plenty of vegetables, fruits, low-fat dairy products, and lean protein.  Do not eat a lot of foods high in solid fats, added sugars, or salt.  Get regular exercise. This is one of the most important things you can do for your health. ? Most adults should exercise for at least 150 minutes each week. The exercise should increase your heart rate and make you sweat (moderate-intensity exercise). ? Most adults should also do strengthening exercises at least twice a week. This is in addition to the moderate-intensity exercise.  Maintain a healthy weight  Body mass index (BMI) is a measurement that can be used to identify possible weight problems. It estimates body fat based on height and weight. Your health care provider can help determine your BMI and help you achieve or maintain a healthy weight.  For females 70 years of age and older: ? A BMI below 18.5 is considered underweight. ? A BMI of 18.5 to 24.9 is normal. ? A BMI of 25 to 29.9 is considered overweight. ? A BMI of 30 and above is considered obese.  Watch levels of cholesterol and blood lipids  You should start having your blood tested for lipids and cholesterol at 70 years of  age, then have this test every 5 years.  You may need to have your cholesterol levels checked more often if: ? Your lipid or cholesterol levels are high. ? You are older than 70 years of age. ? You are at high risk for heart disease.  Cancer screening Lung Cancer  Lung cancer screening is recommended for adults 70-12 years old who are at high risk for lung cancer because of a history of smoking.  A yearly low-dose CT scan of the lungs is recommended for people who: ? Currently smoke. ? Have quit within the past 15 years. ? Have at least a 30-pack-year history of smoking. A pack year is smoking an average of one pack of cigarettes a day for 1 year.  Yearly screening should continue until it has been 15 years since you quit.  Yearly screening should stop if you develop a health problem that would prevent you from having lung cancer treatment.  Breast Cancer  Practice breast self-awareness. This means understanding how your breasts normally appear and feel.  It also means doing regular breast self-exams. Let your health care provider know about any changes, no matter how small.  If you are in your 70s or 30s, you should have a clinical breast exam (CBE) by a health care provider every 1-3 years as part of a regular health exam.  If you are 56 or older, have a CBE every year. Also consider having a breast X-ray (mammogram) every year.  If you have a family history  of breast cancer, talk to your health care provider about genetic screening.  If you are at high risk for breast cancer, talk to your health care provider about having an MRI and a mammogram every year.  Breast cancer gene (BRCA) assessment is recommended for women who have family members with BRCA-related cancers. BRCA-related cancers include: ? Breast. ? Ovarian. ? Tubal. ? Peritoneal cancers.  Results of the assessment will determine the need for genetic counseling and BRCA1 and BRCA2 testing.  Cervical  Cancer Your health care provider may recommend that you be screened regularly for cancer of the pelvic organs (ovaries, uterus, and vagina). This screening involves a pelvic examination, including checking for microscopic changes to the surface of your cervix (Pap test). You may be encouraged to have this screening done every 3 years, beginning at age 52.  For women ages 40-65, health care providers may recommend pelvic exams and Pap testing every 3 years, or they may recommend the Pap and pelvic exam, combined with testing for human papilloma virus (HPV), every 5 years. Some types of HPV increase your risk of cervical cancer. Testing for HPV may also be done on women of any age with unclear Pap test results.  Other health care providers may not recommend any screening for nonpregnant women who are considered low risk for pelvic cancer and who do not have symptoms. Ask your health care provider if a screening pelvic exam is right for you.  If you have had past treatment for cervical cancer or a condition that could lead to cancer, you need Pap tests and screening for cancer for at least 20 years after your treatment. If Pap tests have been discontinued, your risk factors (such as having a new sexual partner) need to be reassessed to determine if screening should resume. Some women have medical problems that increase the chance of getting cervical cancer. In these cases, your health care provider may recommend more frequent screening and Pap tests.  Colorectal Cancer  This type of cancer can be detected and often prevented.  Routine colorectal cancer screening usually begins at 70 years of age and continues through 70 years of age.  Your health care provider may recommend screening at an earlier age if you have risk factors for colon cancer.  Your health care provider may also recommend using home test kits to check for hidden blood in the stool.  A small camera at the end of a tube can be used to  examine your colon directly (sigmoidoscopy or colonoscopy). This is done to check for the earliest forms of colorectal cancer.  Routine screening usually begins at age 34.  Direct examination of the colon should be repeated every 5-10 years through 70 years of age. However, you may need to be screened more often if early forms of precancerous polyps or small growths are found.  Skin Cancer  Check your skin from head to toe regularly.  Tell your health care provider about any new moles or changes in moles, especially if there is a change in a mole's shape or color.  Also tell your health care provider if you have a mole that is larger than the size of a pencil eraser.  Always use sunscreen. Apply sunscreen liberally and repeatedly throughout the day.  Protect yourself by wearing long sleeves, pants, a wide-brimmed hat, and sunglasses whenever you are outside.  Heart disease, diabetes, and high blood pressure  High blood pressure causes heart disease and increases the risk of stroke. High  blood pressure is more likely to develop in: ? People who have blood pressure in the high end of the normal range (130-139/85-89 mm Hg). ? People who are overweight or obese. ? People who are African American.  If you are 69-61 years of age, have your blood pressure checked every 3-5 years. If you are 65 years of age or older, have your blood pressure checked every year. You should have your blood pressure measured twice-once when you are at a hospital or clinic, and once when you are not at a hospital or clinic. Record the average of the two measurements. To check your blood pressure when you are not at a hospital or clinic, you can use: ? An automated blood pressure machine at a pharmacy. ? A home blood pressure monitor.  If you are between 59 years and 22 years old, ask your health care provider if you should take aspirin to prevent strokes.  Have regular diabetes screenings. This involves taking a  blood sample to check your fasting blood sugar level. ? If you are at a normal weight and have a low risk for diabetes, have this test once every three years after 70 years of age. ? If you are overweight and have a high risk for diabetes, consider being tested at a younger age or more often. Preventing infection Hepatitis B  If you have a higher risk for hepatitis B, you should be screened for this virus. You are considered at high risk for hepatitis B if: ? You were born in a country where hepatitis B is common. Ask your health care provider which countries are considered high risk. ? Your parents were born in a high-risk country, and you have not been immunized against hepatitis B (hepatitis B vaccine). ? You have HIV or AIDS. ? You use needles to inject street drugs. ? You live with someone who has hepatitis B. ? You have had sex with someone who has hepatitis B. ? You get hemodialysis treatment. ? You take certain medicines for conditions, including cancer, organ transplantation, and autoimmune conditions.  Hepatitis C  Blood testing is recommended for: ? Everyone born from [birth year] through 1963. ? Anyone with known risk factors for hepatitis C.  Sexually transmitted infections (STIs)  You should be screened for sexually transmitted infections (STIs) including gonorrhea and chlamydia if: ? You are sexually active and are younger than 70 years of age. ? You are older than 70 years of age and your health care provider tells you that you are at risk for this type of infection. ? Your sexual activity has changed since you were last screened and you are at an increased risk for chlamydia or gonorrhea. Ask your health care provider if you are at risk.  If you do not have HIV, but are at risk, it may be recommended that you take a prescription medicine daily to prevent HIV infection. This is called pre-exposure prophylaxis (PrEP). You are considered at risk if: ? You are sexually active and  do not regularly use condoms or know the HIV status of your partner(s). ? You take drugs by injection. ? You are sexually active with a partner who has HIV.  Talk with your health care provider about whether you are at high risk of being infected with HIV. If you choose to begin PrEP, you should first be tested for HIV. You should then be tested every 3 months for as long as you are taking PrEP. Pregnancy  If you are  premenopausal and you may become pregnant, ask your health care provider about preconception counseling.  If you may become pregnant, take 400 to 800 micrograms (mcg) of folic acid every day.  If you want to prevent pregnancy, talk to your health care provider about birth control (contraception). Osteoporosis and menopause  Osteoporosis is a disease in which the bones lose minerals and strength with aging. This can result in serious bone fractures. Your risk for osteoporosis can be identified using a bone density scan.  If you are 76 years of age or older, or if you are at risk for osteoporosis and fractures, ask your health care provider if you should be screened.  Ask your health care provider whether you should take a calcium or vitamin D supplement to lower your risk for osteoporosis.  Menopause may have certain physical symptoms and risks.  Hormone replacement therapy may reduce some of these symptoms and risks. Talk to your health care provider about whether hormone replacement therapy is right for you. Follow these instructions at home:  Schedule regular health, dental, and eye exams.  Stay current with your immunizations.  Do not use any tobacco products including cigarettes, chewing tobacco, or electronic cigarettes.  If you are pregnant, do not drink alcohol.  If you are breastfeeding, limit how much and how often you drink alcohol.  Limit alcohol intake to no more than 1 drink per day for nonpregnant women. One drink equals 12 ounces of beer, 5 ounces of  wine, or 1 ounces of hard liquor.  Do not use street drugs.  Do not share needles.  Ask your health care provider for help if you need support or information about quitting drugs.  Tell your health care provider if you often feel depressed.  Tell your health care provider if you have ever been abused or do not feel safe at home. This information is not intended to replace advice given to you by your health care provider. Make sure you discuss any questions you have with your health care provider. Document Released: 03/26/2011 Document Revised: 02/16/2016 Document Reviewed: 06/14/2015 Elsevier Interactive Patient Education  [employer].

## 2017-12-12 LAB — LIPID PANEL
CHOLESTEROL: 216 mg/dL — AB (ref 0–200)
HDL: 44.4 mg/dL (ref >39.00)
LDL CALC: 148 mg/dL — AB (ref 0–99)
NonHDL: 171.96
TRIGLYCERIDES: 118 mg/dL (ref 0.0–149.0)
Total CHOL/HDL Ratio: 5
VLDL: 23.6 mg/dL (ref 0.0–40.0)

## 2017-12-12 LAB — BASIC METABOLIC PANEL
BUN: 12 mg/dL (ref 6–23)
CHLORIDE: 103 meq/L (ref 96–112)
CO2: 26 meq/L (ref 19–32)
Calcium: 10 mg/dL (ref 8.4–10.5)
Creatinine, Ser: 0.78 mg/dL (ref 0.40–1.20)
GFR: 77.64 mL/min (ref >60.00)
GLUCOSE: 159 mg/dL — AB (ref 70–99)
POTASSIUM: 4.6 meq/L (ref 3.5–5.1)
SODIUM: 141 meq/L (ref 135–145)

## 2017-12-12 LAB — HEPATIC FUNCTION PANEL
ALBUMIN: 4.3 g/dL (ref 3.5–5.2)
ALK PHOS: 79 U/L (ref 39–117)
ALT: 19 U/L (ref 0–35)
AST: 21 U/L (ref 0–37)
BILIRUBIN DIRECT: 0.1 mg/dL (ref 0.0–0.3)
TOTAL PROTEIN: 7.4 g/dL (ref 6.0–8.3)
Total Bilirubin: 0.3 mg/dL (ref 0.2–1.2)

## 2017-12-13 ENCOUNTER — Telehealth: Payer: Self-pay | Admitting: Family Medicine

## 2017-12-13 NOTE — Telephone Encounter (Signed)
Patient states that she is still unsure about the cholesterol medication. She will call back on Monday and let us know.  She is thinking about wanting to try diet/exercise for 3 months and see how that works, but she will call back on Monday and let us know for sure.   Routed to PCP just as Juluis Rainier

## 2017-12-13 NOTE — Telephone Encounter (Signed)
Copied from [facility]. Topic: Quick Communication - Lab Results >> Dec 13, 2017  9:48 AM Ralph Dowdy, CMA wrote: Called patient to inform them of lab results. When patient returns call, triage nurse may disclose results.  NT line is busy, please contact pt back for results.   [phone]

## 2017-12-13 NOTE — Telephone Encounter (Signed)
Returned her call and gave her the lab results from 12/11/17 with Dr. Birdie Riddle.  Your elevated sugar and a 3 month average of sugars shows that you officially have diabetes.   At this time, we do not have to start medication but you really must focus on healthy diet and regular exercise to prevent this from worsening. Also your total cholesterol and LDL (bad cholesterol) are both too high for someone with diabetes.   Based on this, we need to start Lipitor 20 mg nightly and repeat liver functions at a lab only visit in 6-8 weeks. Remainder of labs are stable.  She has concerns about her TSH being 1.95 and "on the lower end of the scale".  She is c/o having dry hair, dry skin (which is probably my heat), weight gain, and I'm [place] all the time.   I've been craving salt.   "I don't know if that means anything or not".   She had radioactive treatment to her thyroid after her 3rd child was born so some of her thyroid is gone.   She didn't know if any of the above symptoms were relevant to this or not.  She also wanted to know if the diabetes could be causing the vision change in her right eye she mentioned to you during her visit.   It's been a year since she had an eye exam when I asked her.  As far as the Lipitor goes she has 2 daughters that are pharmacists.   She wants to discuss the Lipitor with them before committing to it.    She had many questions regarding the Lipitor that I looked up on the [employer] and answered to the best of my ability.     No lab appt scheduled until she calls back with her decision on the Lipitor.  I have routed this note to Dr. Virgil Benedict nurse pool.

## 2017-12-13 NOTE — Telephone Encounter (Signed)
Her thyroid level (TSH of 1.95) is perfect!  TSH is an inverse indication of thyroid function- meaning that if TSH is low, your thyroid is more active and if your TSH is high, it is underactive. With diabetes, you absolutely need to have an eye exam and I would schedule this as soon as possible Also, just having diabetes places you at a much higher risk of heart attack or stroke, which is why it is so important to take the Lipitor. If she has any more questions I would be happy to see her in the office to go over this

## 2017-12-16 ENCOUNTER — Encounter: Payer: Self-pay | Admitting: General Practice

## 2018-02-05 DIAGNOSIS — Z0100 Encounter for examination of eyes and vision without abnormal findings: Secondary | ICD-10-CM | POA: Diagnosis not present

## 2018-02-05 LAB — HM DIABETES EYE EXAM

## 2018-03-18 ENCOUNTER — Ambulatory Visit: Payer: Medicare HMO | Admitting: Family Medicine

## 2018-04-08 ENCOUNTER — Other Ambulatory Visit: Payer: Self-pay

## 2018-04-08 ENCOUNTER — Ambulatory Visit: Payer: Medicare HMO | Admitting: Family Medicine

## 2018-04-08 ENCOUNTER — Encounter: Payer: Self-pay | Admitting: Family Medicine

## 2018-04-08 ENCOUNTER — Encounter: Payer: Self-pay | Admitting: General Practice

## 2018-04-08 VITALS — BP 136/90 | HR 94 | Temp 98.1°F | Resp 16 | Ht 68.0 in | Wt 233.6 lb

## 2018-04-08 DIAGNOSIS — E785 Hyperlipidemia, unspecified: Secondary | ICD-10-CM

## 2018-04-08 DIAGNOSIS — E1169 Type 2 diabetes mellitus with other specified complication: Secondary | ICD-10-CM | POA: Diagnosis not present

## 2018-04-08 DIAGNOSIS — E119 Type 2 diabetes mellitus without complications: Secondary | ICD-10-CM

## 2018-04-08 LAB — LIPID PANEL
CHOL/HDL RATIO: 5
CHOLESTEROL: 207 mg/dL — AB (ref 0–200)
HDL: 40.9 mg/dL (ref >39.00)
LDL Cholesterol: 149 mg/dL — ABNORMAL HIGH (ref 0–99)
NonHDL: 166.08
TRIGLYCERIDES: 86 mg/dL (ref 0.0–149.0)
VLDL: 17.2 mg/dL (ref 0.0–40.0)

## 2018-04-08 LAB — BASIC METABOLIC PANEL
BUN: 14 mg/dL (ref 6–23)
CO2: 27 mEq/L (ref 19–32)
Calcium: 9.7 mg/dL (ref 8.4–10.5)
Chloride: 104 mEq/L (ref 96–112)
Creatinine, Ser: 0.77 mg/dL (ref 0.40–1.20)
GFR: 78.73 mL/min (ref >60.00)
GLUCOSE: 139 mg/dL — AB (ref 70–99)
POTASSIUM: 4.4 meq/L (ref 3.5–5.1)
Sodium: 140 mEq/L (ref 135–145)

## 2018-04-08 LAB — HEPATIC FUNCTION PANEL
ALT: 18 U/L (ref 0–35)
AST: 22 U/L (ref 0–37)
Albumin: 4.1 g/dL (ref 3.5–5.2)
Alkaline Phosphatase: 71 U/L (ref 39–117)
BILIRUBIN TOTAL: 0.6 mg/dL (ref 0.2–1.2)
Bilirubin, Direct: 0.1 mg/dL (ref 0.0–0.3)
Total Protein: 7.3 g/dL (ref 6.0–8.3)

## 2018-04-08 LAB — MICROALBUMIN / CREATININE URINE RATIO
Creatinine,U: 129.8 mg/dL
Microalb Creat Ratio: 1.2 mg/g (ref 0.0–30.0)
Microalb, Ur: 1.5 mg/dL (ref 0.0–1.9)

## 2018-04-08 LAB — HEMOGLOBIN A1C: HEMOGLOBIN A1C: 6.2 % (ref 4.6–6.5)

## 2018-04-08 NOTE — Progress Notes (Signed)
   Subjective:    Patient ID: Mary Ochoa, female    DOB: [birth year], 72 y.o.   MRN: [id number]  HPI DM- new dx at last visit.  A1C 6.9  Since dx, pt has lost nearly 30 lbs.  UTD on eye exam (5/15).  Due foot exam, microalbumin.  No numbness/tingling of hands/feet.  No CP, SOB, HAs, visual changes, edema.  No abd pain, N/V.  Hyperlipidemia- LDL at last visit was 148.  Pt declined statin at that time and wanted to work on diet and exercise.  Since then, she has lost nearly 30 lbs!  Obesity- pt now has 2 obesity related co-morbidities (DM, hyperlipidemia) which qualifies as mordibly obese.  She is down 30 lbs from last visit.  Pt has changed diet and has started exercising.   Review of Systems For ROS see HPI     Objective:   Physical Exam  Constitutional: She is oriented to person, place, and time. She appears well-developed and well-nourished. No distress.  obese  HENT:  Head: Normocephalic and atraumatic.  Eyes: Pupils are equal, round, and reactive to light. Conjunctivae and EOM are normal.  Neck: Normal range of motion. Neck supple. No thyromegaly present.  Cardiovascular: Normal rate, regular rhythm, normal heart sounds and intact distal pulses.  No murmur heard. Pulmonary/Chest: Effort normal and breath sounds normal. No respiratory distress.  Abdominal: Soft. She exhibits no distension. There is no tenderness.  Musculoskeletal: She exhibits no edema.  Lymphadenopathy:    She has no cervical adenopathy.  Neurological: She is alert and oriented to person, place, and time.  Skin: Skin is warm and dry.  Psychiatric: She has a normal mood and affect. Her behavior is normal.  Vitals reviewed.         Assessment & Plan:

## 2018-04-08 NOTE — Assessment & Plan Note (Signed)
New dx at last visit.  UTD on eye exam, foot exam done today and microalbumin ordered.  Applauded her diet and exercise.  Check labs and determine next steps based on results.

## 2018-04-08 NOTE — Assessment & Plan Note (Signed)
Ongoing issue for pt.  She is down 30 lbs from last visit!  I applauded her efforts.  Given that she has a BMI of 35.5 and 2 associated co-morbidities (DM and hyperlipidemia) this qualifies as morbidly obese.  Will continue to follow closely- pt's goal is to get to 200 lbs.

## 2018-04-08 NOTE — Assessment & Plan Note (Signed)
Last LDL was 148.  At that time, she declined statin in favor of diet and exercise.  She is down 30 lbs since then.  Applauded her efforts.  Check labs.  Start meds prn.

## 2018-04-08 NOTE — Patient Instructions (Signed)
Follow up as scheduled We'll notify you of your lab results and make any changes if needed Keep up the good work on healthy diet and regular exercise- you're doing great! Call with any questions or concerns Enjoy the rest of your summer!!!

## 2018-04-09 ENCOUNTER — Encounter: Payer: Self-pay | Admitting: Emergency Medicine

## 2018-04-09 ENCOUNTER — Other Ambulatory Visit: Payer: Self-pay | Admitting: Emergency Medicine

## 2018-04-09 DIAGNOSIS — E1169 Type 2 diabetes mellitus with other specified complication: Secondary | ICD-10-CM

## 2018-04-09 DIAGNOSIS — E785 Hyperlipidemia, unspecified: Principal | ICD-10-CM

## 2018-04-09 MED ORDER — ATORVASTATIN CALCIUM 20 MG PO TABS: 20.0000 mg | ORAL_TABLET | Freq: Every day | ORAL | 3 refills | Status: DC

## 2018-04-15 ENCOUNTER — Telehealth: Payer: Self-pay | Admitting: Emergency Medicine

## 2018-04-15 NOTE — Telephone Encounter (Signed)
Reason for CRM: patient is requesting Amy to contact her.

## 2018-04-15 NOTE — Telephone Encounter (Signed)
Can you please investigate?

## 2018-04-15 NOTE — Telephone Encounter (Signed)
Patient states she never had Bone Density Report and was told that she needed to take Calcium and Vitamin D Per Letter.   Please advise.   Mary Hall,  LPN

## 2018-04-16 NOTE — Telephone Encounter (Signed)
Will look into this. I believe it was a health maintenance that Tabori had signed off on. Was already sent to scanning. Will keep a look out for when it reaches pt chart.

## 2018-04-22 ENCOUNTER — Ambulatory Visit
Admission: RE | Admit: 2018-04-22 | Discharge: 2018-04-22 | Disposition: A | Discharge status: Home / Self Care | Payer: Medicare HMO | Source: Ambulatory Visit | Attending: Family Medicine | Admitting: Family Medicine

## 2018-04-22 ENCOUNTER — Other Ambulatory Visit: Payer: Self-pay | Admitting: Family Medicine

## 2018-04-22 DIAGNOSIS — R921 Mammographic calcification found on diagnostic imaging of breast: Secondary | ICD-10-CM

## 2018-04-22 DIAGNOSIS — Z9889 Other specified postprocedural states: Secondary | ICD-10-CM

## 2018-04-24 ENCOUNTER — Other Ambulatory Visit: Payer: Self-pay | Admitting: Family Medicine

## 2018-04-24 DIAGNOSIS — Z9889 Other specified postprocedural states: Secondary | ICD-10-CM

## 2018-04-24 DIAGNOSIS — R921 Mammographic calcification found on diagnostic imaging of breast: Secondary | ICD-10-CM

## 2018-04-28 ENCOUNTER — Ambulatory Visit
Admission: RE | Admit: 2018-04-28 | Discharge: 2018-04-28 | Disposition: A | Discharge status: Home / Self Care | Payer: Medicare HMO | Source: Ambulatory Visit | Attending: Family Medicine | Admitting: Family Medicine

## 2018-04-28 DIAGNOSIS — R921 Mammographic calcification found on diagnostic imaging of breast: Secondary | ICD-10-CM

## 2018-04-28 DIAGNOSIS — D241 Benign neoplasm of right breast: Secondary | ICD-10-CM | POA: Diagnosis not present

## 2018-04-28 DIAGNOSIS — Z9889 Other specified postprocedural states: Secondary | ICD-10-CM

## 2018-04-28 HISTORY — PX: BREAST BIOPSY: SHX20

## 2018-05-14 IMAGING — MG 2D DIGITAL DIAGNOSTIC UNILATERAL RIGHT MAMMOGRAM WITH CAD AND AD
4 series · 4 of 12 positions shown · non-contrast
Comparison: Previous exam(s).

CLINICAL DATA: Possible asymmetry in the outer right breast on a
recent screening mammogram without comparisons.

EXAM:
DIGITAL DIAGNOSTIC RIGHT MAMMOGRAM
ULTRASOUND RIGHT BREAST

[R CC]
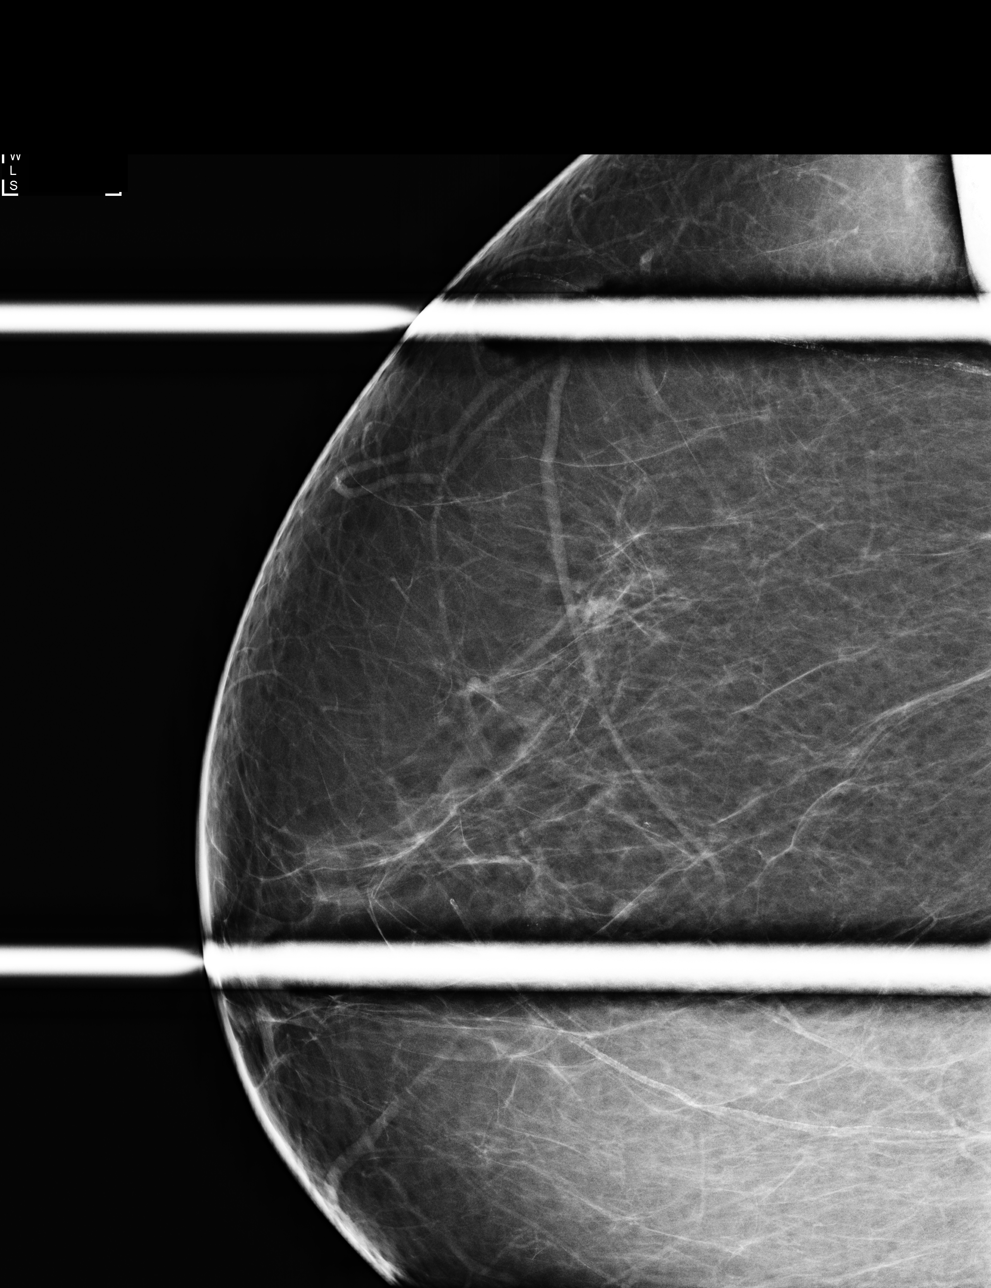

[R MLO]
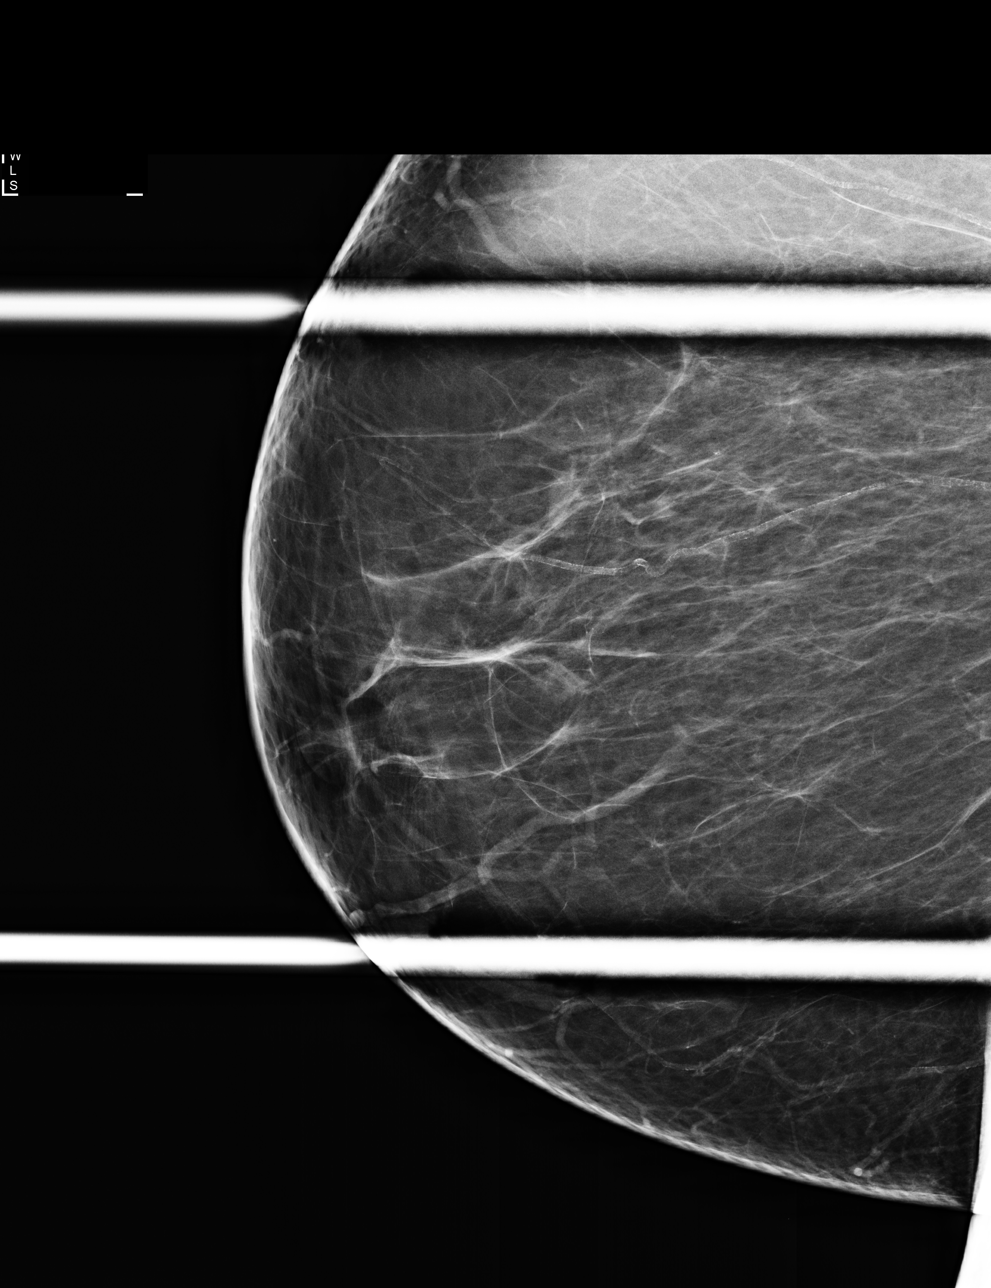

[R CC tomo · tomo slice 39/78.0]
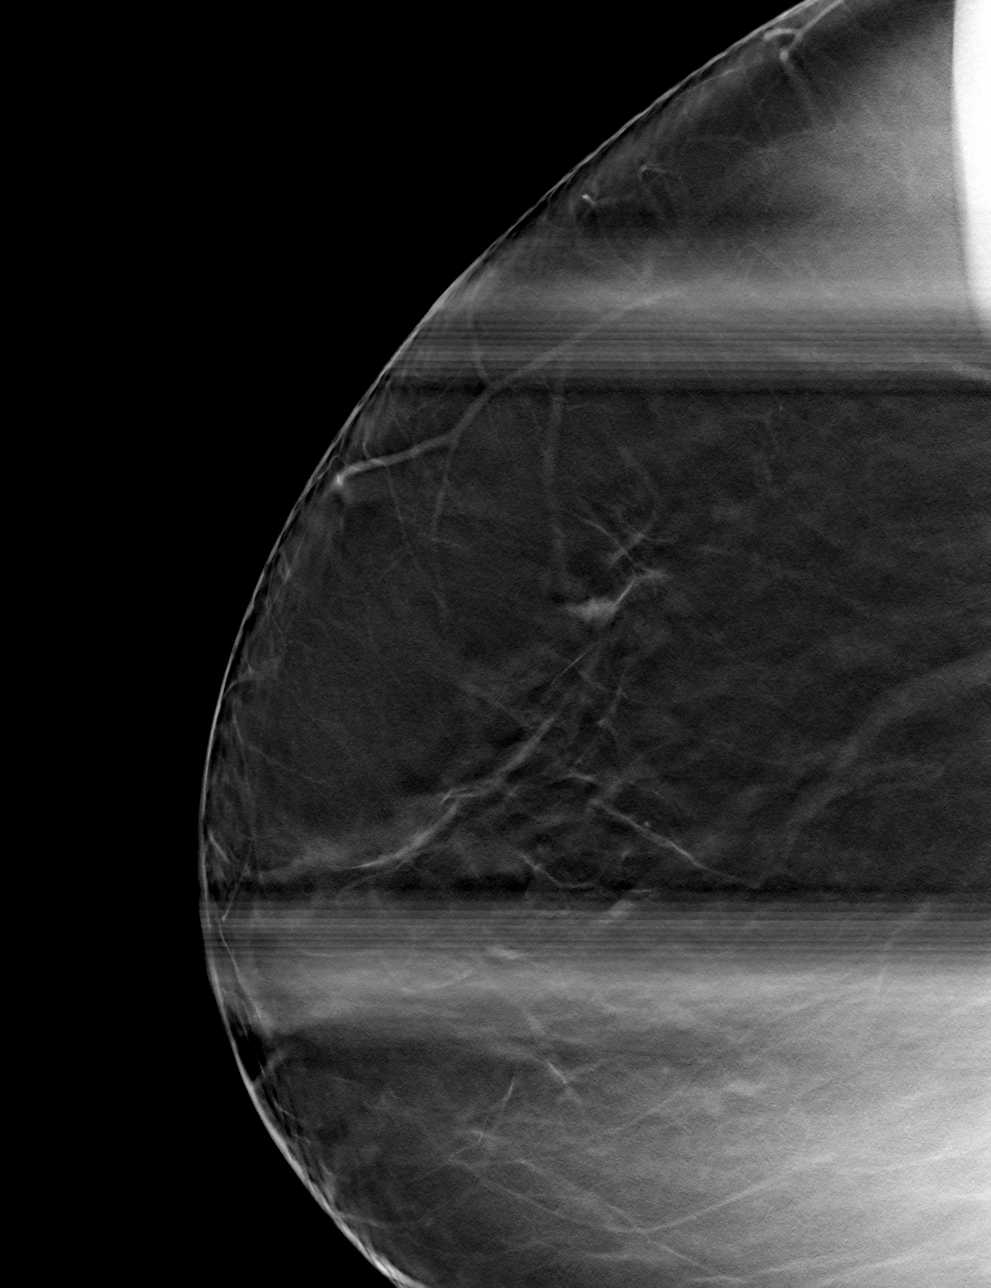

[R MLO tomo · tomo slice 39/76.0]
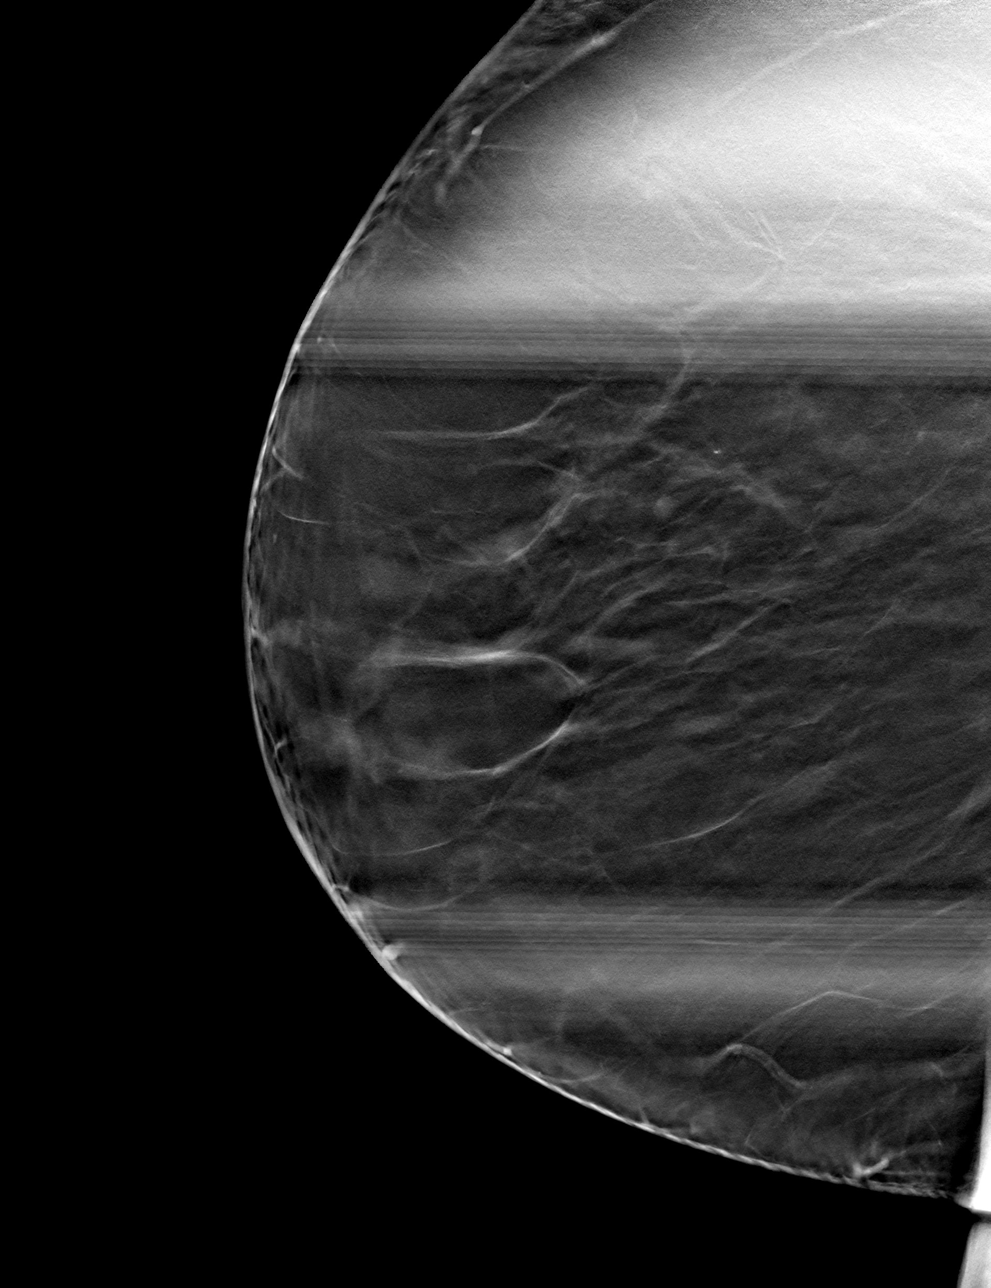

[4 of 12 positions shown; findings below may reference images not displayed]

ACR Breast Density Category b: There are scattered areas of
fibroglandular density.
FINDINGS: 2D and 3D spot compression views of the right breast were obtained.
These confirm a small, irregular area of asymmetrical density
measuring 10 x 4 mm and containing a small amount of punctate,
rounded microcalcification in the upper outer right breast in the
middle third.

On physical exam, no mass is palpable in the upper outer right
breast.

Targeted ultrasound is performed, showing no visible abnormality
corresponding to the irregular asymmetrical density in the outer
right breast.
IMPRESSION: 10 x 4 mm irregular asymmetrical density in the upper outer right
breast with no ultrasound correlate. This is concerning for the
possibility of malignancy.

RECOMMENDATION:
3D stereotactic guided core needle biopsy of the 10 x 4 mm irregular
asymmetrical density in the upper outer right breast. This has been
discussed with the patient and scheduled at [DATE] a.m. on
07/12/2016.

I have discussed the findings and recommendations with the patient.
Results were also provided in writing at the conclusion of the
visit. If applicable, a reminder letter will be sent to the patient
regarding the next appointment.

BI-RADS CATEGORY  4: Suspicious.

## 2018-05-20 ENCOUNTER — Other Ambulatory Visit: Payer: Medicare HMO

## 2018-06-24 ENCOUNTER — Encounter: Payer: Medicare HMO | Admitting: Family Medicine

## 2018-10-23 ENCOUNTER — Ambulatory Visit
Admission: RE | Admit: 2018-10-23 | Discharge: 2018-10-23 | Disposition: A | Discharge status: Home / Self Care | Payer: Medicare HMO | Source: Ambulatory Visit | Attending: Family Medicine | Admitting: Family Medicine

## 2018-10-23 DIAGNOSIS — Z853 Personal history of malignant neoplasm of breast: Secondary | ICD-10-CM | POA: Diagnosis not present

## 2018-10-23 DIAGNOSIS — R921 Mammographic calcification found on diagnostic imaging of breast: Secondary | ICD-10-CM

## 2018-10-23 DIAGNOSIS — R928 Other abnormal and inconclusive findings on diagnostic imaging of breast: Secondary | ICD-10-CM | POA: Diagnosis not present

## 2018-10-23 DIAGNOSIS — Z9889 Other specified postprocedural states: Secondary | ICD-10-CM

## 2018-10-24 ENCOUNTER — Other Ambulatory Visit: Payer: Self-pay

## 2018-10-24 ENCOUNTER — Ambulatory Visit (INDEPENDENT_AMBULATORY_CARE_PROVIDER_SITE_OTHER): Payer: Medicare HMO | Admitting: Family Medicine

## 2018-10-24 ENCOUNTER — Encounter: Payer: Self-pay | Admitting: Family Medicine

## 2018-10-24 VITALS — BP 124/68 | HR 42 | Temp 98.1°F | Resp 16 | Ht 68.0 in | Wt 215.0 lb

## 2018-10-24 DIAGNOSIS — E785 Hyperlipidemia, unspecified: Secondary | ICD-10-CM

## 2018-10-24 DIAGNOSIS — Z23 Encounter for immunization: Secondary | ICD-10-CM

## 2018-10-24 DIAGNOSIS — E119 Type 2 diabetes mellitus without complications: Secondary | ICD-10-CM | POA: Diagnosis not present

## 2018-10-24 DIAGNOSIS — E1169 Type 2 diabetes mellitus with other specified complication: Secondary | ICD-10-CM

## 2018-10-24 DIAGNOSIS — Z0000 Encounter for general adult medical examination without abnormal findings: Secondary | ICD-10-CM

## 2018-10-24 LAB — CBC WITH DIFFERENTIAL/PLATELET
BASOS PCT: 0.9 % (ref 0.0–3.0)
Basophils Absolute: 0.1 10*3/uL (ref 0.0–0.1)
EOS ABS: 0.1 10*3/uL (ref 0.0–0.7)
EOS PCT: 1.4 % (ref 0.0–5.0)
HEMATOCRIT: 43.2 % (ref 36.0–46.0)
Hemoglobin: 14.5 g/dL (ref 12.0–15.0)
LYMPHS PCT: 25 % (ref 12.0–46.0)
Lymphs Abs: 1.8 10*3/uL (ref 0.7–4.0)
MCHC: 33.5 g/dL (ref 30.0–36.0)
MCV: 93.5 fl (ref 78.0–100.0)
MONO ABS: 0.5 10*3/uL (ref 0.1–1.0)
Monocytes Relative: 7.1 % (ref 3.0–12.0)
NEUTROS ABS: 4.9 10*3/uL (ref 1.4–7.7)
Neutrophils Relative %: 65.6 % (ref 43.0–77.0)
PLATELETS: 321 10*3/uL (ref 150.0–400.0)
RBC: 4.62 Mil/uL (ref 3.87–5.11)
RDW: 13.1 % (ref 11.5–15.5)
WBC: 7.4 10*3/uL (ref 4.0–10.5)

## 2018-10-24 LAB — BASIC METABOLIC PANEL
BUN: 13 mg/dL (ref 6–23)
CO2: 28 mEq/L (ref 19–32)
Calcium: 10.1 mg/dL (ref 8.4–10.5)
Chloride: 102 mEq/L (ref 96–112)
Creatinine, Ser: 0.71 mg/dL (ref 0.40–1.20)
GFR: 81.22 mL/min (ref >60.00)
Glucose, Bld: 109 mg/dL — ABNORMAL HIGH (ref 70–99)
Potassium: 4.2 mEq/L (ref 3.5–5.1)
SODIUM: 139 meq/L (ref 135–145)

## 2018-10-24 LAB — LIPID PANEL
CHOL/HDL RATIO: 5
Cholesterol: 191 mg/dL (ref 0–200)
HDL: 42 mg/dL (ref >39.00)
LDL Cholesterol: 132 mg/dL — ABNORMAL HIGH (ref 0–99)
NONHDL: 148.57
Triglycerides: 82 mg/dL (ref 0.0–149.0)
VLDL: 16.4 mg/dL (ref 0.0–40.0)

## 2018-10-24 LAB — HEPATIC FUNCTION PANEL
ALT: 16 U/L (ref 0–35)
AST: 20 U/L (ref 0–37)
Albumin: 4.2 g/dL (ref 3.5–5.2)
Alkaline Phosphatase: 71 U/L (ref 39–117)
BILIRUBIN DIRECT: 0.1 mg/dL (ref 0.0–0.3)
BILIRUBIN TOTAL: 0.6 mg/dL (ref 0.2–1.2)
Total Protein: 7 g/dL (ref 6.0–8.3)

## 2018-10-24 LAB — HEMOGLOBIN A1C: Hgb A1c MFr Bld: 5.9 % (ref 4.6–6.5)

## 2018-10-24 LAB — TSH: TSH: 0.76 u[IU]/mL (ref 0.35–4.50)

## 2018-10-24 NOTE — Progress Notes (Signed)
   Subjective:    Patient ID: Mary Ochoa, female    DOB: August 06, 1946, 73 y.o.   MRN: [id number]  HPI CPE- UTD on mammo, due for colonoscopy (pt has refused in the past- again declines).  UTD on eye exam, foot exam, microalbumin.  Declines pneumonia vaccines, will do flu shot.   Review of Systems Patient reports no vision/ hearing changes, adenopathy,fever, weight change,  persistant/recurrent hoarseness , swallowing issues, chest pain, palpitations, edema, persistant/recurrent cough, hemoptysis, dyspnea (rest/exertional/paroxysmal nocturnal), gastrointestinal bleeding (melena, rectal bleeding), abdominal pain, significant heartburn, bowel changes, GU symptoms (dysuria, hematuria, incontinence), Gyn symptoms (abnormal  bleeding, pain),  syncope, focal weakness, memory loss, numbness & tingling, skin/hair/nail changes, abnormal bruising or bleeding, anxiety, or depression.     Objective:   Physical Exam General Appearance:    Alert, cooperative, no distress, appears stated age  Head:    Normocephalic, without obvious abnormality, atraumatic  Eyes:    PERRL, conjunctiva/corneas clear, EOM's intact, fundi    benign, both eyes  Ears:    Normal TM's and external ear canals, both ears  Nose:   Nares normal, septum midline, mucosa normal, no drainage    or sinus tenderness  Throat:   Lips, mucosa, and tongue normal; teeth and gums normal  Neck:   Supple, symmetrical, trachea midline, no adenopathy;    Thyroid: no enlargement/tenderness/nodules  Back:     Symmetric, no curvature, ROM normal, no CVA tenderness  Lungs:     Clear to auscultation bilaterally, respirations unlabored  Chest Wall:    No tenderness or deformity   Heart:    Regular rate and rhythm, S1 and S2 normal, no murmur, rub   or gallop  Breast Exam:    Deferred to mammo  Abdomen:     Soft, non-tender, bowel sounds active all four quadrants,    no masses, no organomegaly  Genitalia:    Deferred  Rectal:    Extremities:    Extremities normal, atraumatic, no cyanosis or edema  Pulses:   2+ and symmetric all extremities  Skin:   Skin color, texture, turgor normal, no rashes or lesions  Lymph nodes:   Cervical, supraclavicular, and axillary nodes normal  Neurologic:   CNII-XII intact, normal strength, sensation and reflexes    throughout          Assessment & Plan:

## 2018-10-24 NOTE — Assessment & Plan Note (Signed)
Pt's PE WNL w/ exception of being overweight.  UTD on mammo.  Pt refuses colonoscopy.  Flu shot given.  Check labs.  Anticipatory guidance provided.

## 2018-10-24 NOTE — Patient Instructions (Signed)
Follow up in 6 months to recheck sugar, cholesterol and weight loss progress We'll notify you of your lab results and make any changes if needed Continue to work on healthy diet and regular exercise- you look great! Call with any questions or concerns Have a great weekend!!!

## 2018-10-24 NOTE — Assessment & Plan Note (Signed)
Chronic problem.  UTD on foot exam, eye exam, and microalbumin.  Applauded her efforts at healthy diet and regular exercise.  Check labs and determine if medication is needed.

## 2018-10-24 NOTE — Assessment & Plan Note (Signed)
Chronic problem.  Pt refused her statin.  Is working on diet and exercise.  Check labs.  Adjust meds prn

## 2018-10-27 ENCOUNTER — Other Ambulatory Visit: Payer: Self-pay | Admitting: General Practice

## 2018-10-27 DIAGNOSIS — E1169 Type 2 diabetes mellitus with other specified complication: Secondary | ICD-10-CM

## 2018-10-27 DIAGNOSIS — E785 Hyperlipidemia, unspecified: Principal | ICD-10-CM

## 2018-10-27 MED ORDER — ATORVASTATIN CALCIUM 20 MG PO TABS: 20.0000 mg | ORAL_TABLET | Freq: Every day | ORAL | 6 refills | Status: DC

## 2019-05-22 ENCOUNTER — Encounter: Payer: Self-pay | Admitting: Physician Assistant

## 2019-05-22 ENCOUNTER — Ambulatory Visit (INDEPENDENT_AMBULATORY_CARE_PROVIDER_SITE_OTHER): Payer: Medicare HMO | Admitting: Physician Assistant

## 2019-05-22 ENCOUNTER — Other Ambulatory Visit: Payer: Self-pay

## 2019-05-22 VITALS — BP 142/70 | HR 70 | Temp 96.5°F | Resp 16 | Ht 68.0 in | Wt 226.0 lb

## 2019-05-22 DIAGNOSIS — Z1211 Encounter for screening for malignant neoplasm of colon: Secondary | ICD-10-CM | POA: Diagnosis not present

## 2019-05-22 DIAGNOSIS — R1011 Right upper quadrant pain: Secondary | ICD-10-CM

## 2019-05-22 LAB — COMPREHENSIVE METABOLIC PANEL
ALT: 13 U/L (ref 0–35)
AST: 18 U/L (ref 0–37)
Albumin: 4.4 g/dL (ref 3.5–5.2)
Alkaline Phosphatase: 73 U/L (ref 39–117)
BUN: 16 mg/dL (ref 6–23)
CO2: 25 mEq/L (ref 19–32)
Calcium: 9.9 mg/dL (ref 8.4–10.5)
Chloride: 104 mEq/L (ref 96–112)
Creatinine, Ser: 0.77 mg/dL (ref 0.40–1.20)
GFR: 73.84 mL/min (ref >60.00)
Glucose, Bld: 121 mg/dL — ABNORMAL HIGH (ref 70–99)
Potassium: 4.5 mEq/L (ref 3.5–5.1)
Sodium: 139 mEq/L (ref 135–145)
Total Bilirubin: 0.6 mg/dL (ref 0.2–1.2)
Total Protein: 7.4 g/dL (ref 6.0–8.3)

## 2019-05-22 LAB — CBC WITH DIFFERENTIAL/PLATELET
Basophils Absolute: 0.1 10*3/uL (ref 0.0–0.1)
Basophils Relative: 1 % (ref 0.0–3.0)
Eosinophils Absolute: 0.1 10*3/uL (ref 0.0–0.7)
Eosinophils Relative: 0.7 % (ref 0.0–5.0)
HCT: 43.2 % (ref 36.0–46.0)
Hemoglobin: 14.5 g/dL (ref 12.0–15.0)
Lymphocytes Relative: 19.8 % (ref 12.0–46.0)
Lymphs Abs: 1.6 10*3/uL (ref 0.7–4.0)
MCHC: 33.5 g/dL (ref 30.0–36.0)
MCV: 93.1 fl (ref 78.0–100.0)
Monocytes Absolute: 0.5 10*3/uL (ref 0.1–1.0)
Monocytes Relative: 6.5 % (ref 3.0–12.0)
Neutro Abs: 5.9 10*3/uL (ref 1.4–7.7)
Neutrophils Relative %: 72 % (ref 43.0–77.0)
Platelets: 340 10*3/uL (ref 150.0–400.0)
RBC: 4.64 Mil/uL (ref 3.87–5.11)
RDW: 13.3 % (ref 11.5–15.5)
WBC: 8.2 10*3/uL (ref 4.0–10.5)

## 2019-05-22 LAB — URINALYSIS, ROUTINE W REFLEX MICROSCOPIC
Bilirubin Urine: NEGATIVE
Hgb urine dipstick: NEGATIVE
Ketones, ur: NEGATIVE
Nitrite: NEGATIVE
RBC / HPF: NONE SEEN (ref 0–?)
Specific Gravity, Urine: 1.025 (ref 1.000–1.030)
Urine Glucose: NEGATIVE
Urobilinogen, UA: 0.2 (ref 0.0–1.0)
pH: 6 (ref 5.0–8.0)

## 2019-05-22 LAB — LIPASE: Lipase: 19 U/L (ref 11.0–59.0)

## 2019-05-22 LAB — TSH: TSH: 0.99 u[IU]/mL (ref 0.35–4.50)

## 2019-05-22 NOTE — Progress Notes (Signed)
Patient presents to clinic today c/o episodes of colicky right upper quadrant pain over the past few weeks.  Symptoms are usually postprandial, especially if she has something spicy or drinks a carbonated beverage.  Endorses some bloating.  Denies heartburn, nausea or vomiting.  Pain typically occurring within an hour after eating.  She is unable to specify duration of symptoms per episode.  Denies any noted change to bowel or bladder habits.  Denies fever, chills, malaise or fatigue.  Denies trauma or injury to the area.  Denies pain with range of motion.  Is completely asymptomatic.  Present.  Patient would like a new order for Cologuard.  States the last one she received she sent back but has changed her mind about completing test.  Will place new order for her.  Past Medical History:  Diagnosis Date  . Complication of anesthesia   . History of radiation therapy 09/03/16-09/07/16   Right breast 34 gray in 10 fractions - [facility].  . Malignant neoplasm of upper-outer quadrant of right female breast ([place]) 07/18/2016  . Personal history of radiation therapy   . PONV (postoperative nausea and vomiting)     Current Outpatient Medications on File Prior to Visit  Medication Sig Dispense Refill  . cholecalciferol (VITAMIN D3) 25 MCG (1000 UT) tablet Take 1,000 Units by mouth daily.    . Multiple Vitamins-Minerals (MULTIVITAMIN WITH MINERALS) tablet Take 1 tablet by mouth daily.     No current facility-administered medications on file prior to visit.     Allergies  Allergen Reactions  . Tape Rash    medipore tape.      Family History  Problem Relation Age of Onset  . Breast cancer Mother   . Alcohol abuse Father     Social History   Socioeconomic History  . Marital status: Widowed    Spouse name: Not on file  . Number of children: Not on file  . Years of education: Not on file  . Highest education level: Not on file  Occupational History  . Not on file  Social Needs  .  Financial resource strain: Not on file  . Food insecurity    Worry: Not on file    Inability: Not on file  . Transportation needs    Medical: Not on file    Non-medical: Not on file  Tobacco Use  . Smoking status: Never Smoker  . Smokeless tobacco: Never Used  Substance and Sexual Activity  . Alcohol use: No  . Drug use: No  . Sexual activity: Not Currently  Lifestyle  . Physical activity    Days per week: Not on file    Minutes per session: Not on file  . Stress: Not on file  Relationships  . Social Herbalist on phone: Not on file    Gets together: Not on file    Attends religious service: Not on file    Active member of club or organization: Not on file    Attends meetings of clubs or organizations: Not on file    Relationship status: Not on file  Other Topics Concern  . Not on file  Social History Narrative  . Not on file   Review of Systems - See HPI.  All other ROS are negative.  BP (!) 142/70   Pulse 70   Temp (!) 96.5 F (35.8 C) (Skin)   Resp 16   Ht 5' 8"  (1.727 m)   Wt 226 lb (102.5 kg)  SpO2 98%   BMI 34.36 kg/m   Physical Exam Vitals signs reviewed.  Constitutional:      Appearance: She is well-developed. She is obese.  HENT:     Head: Normocephalic and atraumatic.     Mouth/Throat:     Mouth: Mucous membranes are moist.  Eyes:     Extraocular Movements: Extraocular movements intact.  Cardiovascular:     Rate and Rhythm: Normal rate and regular rhythm.     Heart sounds: Normal heart sounds.  Pulmonary:     Effort: Pulmonary effort is normal.     Breath sounds: Normal breath sounds.  Abdominal:     Tenderness: There is no abdominal tenderness. There is no right CVA tenderness, left CVA tenderness, guarding or rebound. Negative signs include Murphy's sign.     Hernia: No hernia is present.  Neurological:     Mental Status: She is alert.    Assessment/Plan: 1. Colicky RUQ abdominal pain Asymptomatic at present.  Exam  unremarkable.  Given colicky nature of symptoms and that they are mainly postprandial, need further assessment of gallbladder.  Will obtain labs today as noted below.  Order for ultrasound right upper quadrant placed.  Strict ER precautions reviewed with patient.  Dietary recommendations given as well.  Handout attached to AVS. - CBC w/Diff - Comp Met (CMET) - Lipase - Urinalysis, Routine w reflex microscopic - US Abdomen Limited RUQ; Future - TSH  2. Colon cancer screening Order for Cologuard placed.  Mild elevation in blood pressure noted today.  Reviewed DASH diet with patient.  Patient to follow-up with PCP for monitoring. - Cologuard    Leeanne Rio, PA-C

## 2019-05-22 NOTE — Patient Instructions (Addendum)
Please go to the lab today for blood work.  I will call you with your results. We will alter treatment regimen(s) if indicated by your results.   Avoid carbonated beverages, heavy/greasy foods or spicy foods.  Please stop at the front desk to speak with staff about your Ultrasound.  We will call with results as soon as they are available. If anything acutely worsens with symptoms before workup is complete. Please go to the ER.   You will receive a new cologuard test in the mail.

## 2019-05-26 ENCOUNTER — Encounter: Payer: Self-pay | Admitting: General Practice

## 2019-05-26 ENCOUNTER — Ambulatory Visit
Admission: RE | Admit: 2019-05-26 | Discharge: 2019-05-26 | Disposition: A | Discharge status: Home / Self Care | Payer: Medicare HMO | Source: Ambulatory Visit | Attending: Physician Assistant | Admitting: Physician Assistant

## 2019-05-26 DIAGNOSIS — R1011 Right upper quadrant pain: Secondary | ICD-10-CM

## 2019-05-26 DIAGNOSIS — K8020 Calculus of gallbladder without cholecystitis without obstruction: Secondary | ICD-10-CM | POA: Diagnosis not present

## 2019-05-27 ENCOUNTER — Other Ambulatory Visit: Payer: Self-pay | Admitting: Emergency Medicine

## 2019-05-27 DIAGNOSIS — K8020 Calculus of gallbladder without cholecystitis without obstruction: Secondary | ICD-10-CM

## 2019-05-27 DIAGNOSIS — R1011 Right upper quadrant pain: Secondary | ICD-10-CM

## 2019-06-02 DIAGNOSIS — Z1211 Encounter for screening for malignant neoplasm of colon: Secondary | ICD-10-CM | POA: Diagnosis not present

## 2019-06-02 LAB — FECAL OCCULT BLOOD, GUAIAC: Fecal Occult Blood: NEGATIVE

## 2019-06-09 LAB — COLOGUARD

## 2019-06-18 ENCOUNTER — Encounter: Payer: Self-pay | Admitting: General Practice

## 2019-09-17 DIAGNOSIS — Z03818 Encounter for observation for suspected exposure to other biological agents ruled out: Secondary | ICD-10-CM | POA: Diagnosis not present

## 2020-02-25 ENCOUNTER — Telehealth: Payer: Self-pay | Admitting: Family Medicine

## 2020-02-25 NOTE — Telephone Encounter (Signed)
LM for to call and schedule an appt with Dr. Birdie Riddle   This patient needs an appointment to discuss the following diagnosis:   DIABETES WITH CHRONIC COMPLICATIONS(E11.69)   MORBID OBESITY(E66.01)    Please let me know when they have been scheduled.   Any questions give me a call [phone]
